# Patient Record
Sex: Male | Born: 1985 | Race: White | Hispanic: No | Marital: Married | Smoking: Current every day smoker
Health system: Southern US, Community
[De-identification: ages and names within clinical notes are randomized; demographics above are authoritative.]

## PROBLEM LIST (undated history)

## (undated) DIAGNOSIS — I10 Essential (primary) hypertension: Secondary | ICD-10-CM

## (undated) DIAGNOSIS — F32A Depression, unspecified: Secondary | ICD-10-CM

## (undated) DIAGNOSIS — F603 Borderline personality disorder: Secondary | ICD-10-CM

## (undated) DIAGNOSIS — F329 Major depressive disorder, single episode, unspecified: Secondary | ICD-10-CM

## (undated) DIAGNOSIS — F909 Attention-deficit hyperactivity disorder, unspecified type: Secondary | ICD-10-CM

## (undated) HISTORY — PX: EXTERNAL EAR SURGERY: SHX627

---

## 2010-08-24 ENCOUNTER — Emergency Department (HOSPITAL_COMMUNITY)
Admission: EM | Admit: 2010-08-24 | Discharge: 2010-08-24 | Disposition: A | Discharge status: Home / Self Care | Payer: Self-pay | Attending: Emergency Medicine | Admitting: Emergency Medicine

## 2010-08-24 DIAGNOSIS — Z0389 Encounter for observation for other suspected diseases and conditions ruled out: Secondary | ICD-10-CM | POA: Insufficient documentation

## 2010-09-07 ENCOUNTER — Ambulatory Visit (HOSPITAL_COMMUNITY): Payer: Self-pay | Admitting: Physician Assistant

## 2010-10-14 ENCOUNTER — Ambulatory Visit (HOSPITAL_COMMUNITY): Payer: Self-pay | Admitting: Physician Assistant

## 2010-11-18 ENCOUNTER — Ambulatory Visit (HOSPITAL_COMMUNITY): Payer: Self-pay | Admitting: Psychiatry

## 2013-10-21 ENCOUNTER — Emergency Department (HOSPITAL_COMMUNITY): Payer: Self-pay

## 2013-10-21 ENCOUNTER — Encounter (HOSPITAL_COMMUNITY): Payer: Self-pay | Admitting: Emergency Medicine

## 2013-10-21 ENCOUNTER — Emergency Department (HOSPITAL_COMMUNITY)
Admission: EM | Admit: 2013-10-21 | Discharge: 2013-10-21 | Disposition: A | Discharge status: Home / Self Care | Payer: Self-pay | Attending: Emergency Medicine | Admitting: Emergency Medicine

## 2013-10-21 DIAGNOSIS — S0285XA Fracture of orbit, unspecified, initial encounter for closed fracture: Secondary | ICD-10-CM

## 2013-10-21 DIAGNOSIS — S59919A Unspecified injury of unspecified forearm, initial encounter: Secondary | ICD-10-CM

## 2013-10-21 DIAGNOSIS — S0230XA Fracture of orbital floor, unspecified side, initial encounter for closed fracture: Secondary | ICD-10-CM | POA: Insufficient documentation

## 2013-10-21 DIAGNOSIS — R404 Transient alteration of awareness: Secondary | ICD-10-CM | POA: Insufficient documentation

## 2013-10-21 DIAGNOSIS — S59909A Unspecified injury of unspecified elbow, initial encounter: Secondary | ICD-10-CM | POA: Insufficient documentation

## 2013-10-21 DIAGNOSIS — S6990XA Unspecified injury of unspecified wrist, hand and finger(s), initial encounter: Secondary | ICD-10-CM | POA: Insufficient documentation

## 2013-10-21 DIAGNOSIS — Z23 Encounter for immunization: Secondary | ICD-10-CM | POA: Insufficient documentation

## 2013-10-21 DIAGNOSIS — R000 Tachycardia, unspecified: Secondary | ICD-10-CM | POA: Insufficient documentation

## 2013-10-21 MED ORDER — HYDROCODONE-ACETAMINOPHEN 5-325 MG PO TABS: 2.0000 | ORAL_TABLET | Freq: Four times a day (QID) | ORAL | Status: DC | PRN

## 2013-10-21 MED ORDER — SODIUM CHLORIDE 0.9 % IV BOLUS (SEPSIS)
1000.0000 mL | Freq: Once | INTRAVENOUS | Status: AC
  Administered 2013-10-21: 1000 mL via INTRAVENOUS

## 2013-10-21 MED ORDER — FENTANYL CITRATE 0.05 MG/ML IJ SOLN
50.0000 ug | Freq: Once | INTRAMUSCULAR | Status: AC
Start: 2013-10-21 — End: 2013-10-21
  Administered 2013-10-21: 50 ug via INTRAVENOUS
  Filled 2013-10-21: qty 2

## 2013-10-21 MED ORDER — TETANUS-DIPHTH-ACELL PERTUSSIS 5-2.5-18.5 LF-MCG/0.5 IM SUSP
0.5000 mL | Freq: Once | INTRAMUSCULAR | Status: AC
  Administered 2013-10-21: 0.5 mL via INTRAMUSCULAR
  Filled 2013-10-21: qty 0.5

## 2013-10-21 NOTE — ED Provider Notes (Signed)
  Medical screening examination/treatment/procedure(s) were performed by non-physician practitioner and as supervising physician I was immediately available for consultation/collaboration.   EKG Interpretation None         Gerhard Munchobert Avid Guillette, MD 10/21/13 1239
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
## 2013-10-21 NOTE — ED Notes (Signed)
PA at bedside.
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
## 2013-10-21 NOTE — ED Notes (Signed)
Patient transported to X-ray 
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
## 2016-01-22 IMAGING — CR DG HAND COMPLETE 3+V*L*
3 series · 3 of 3 positions shown · non-contrast
Comparison: None.

CLINICAL DATA: Hand injury.

EXAM:
LEFT HAND - COMPLETE 3+ VIEW

[x hand pa left]
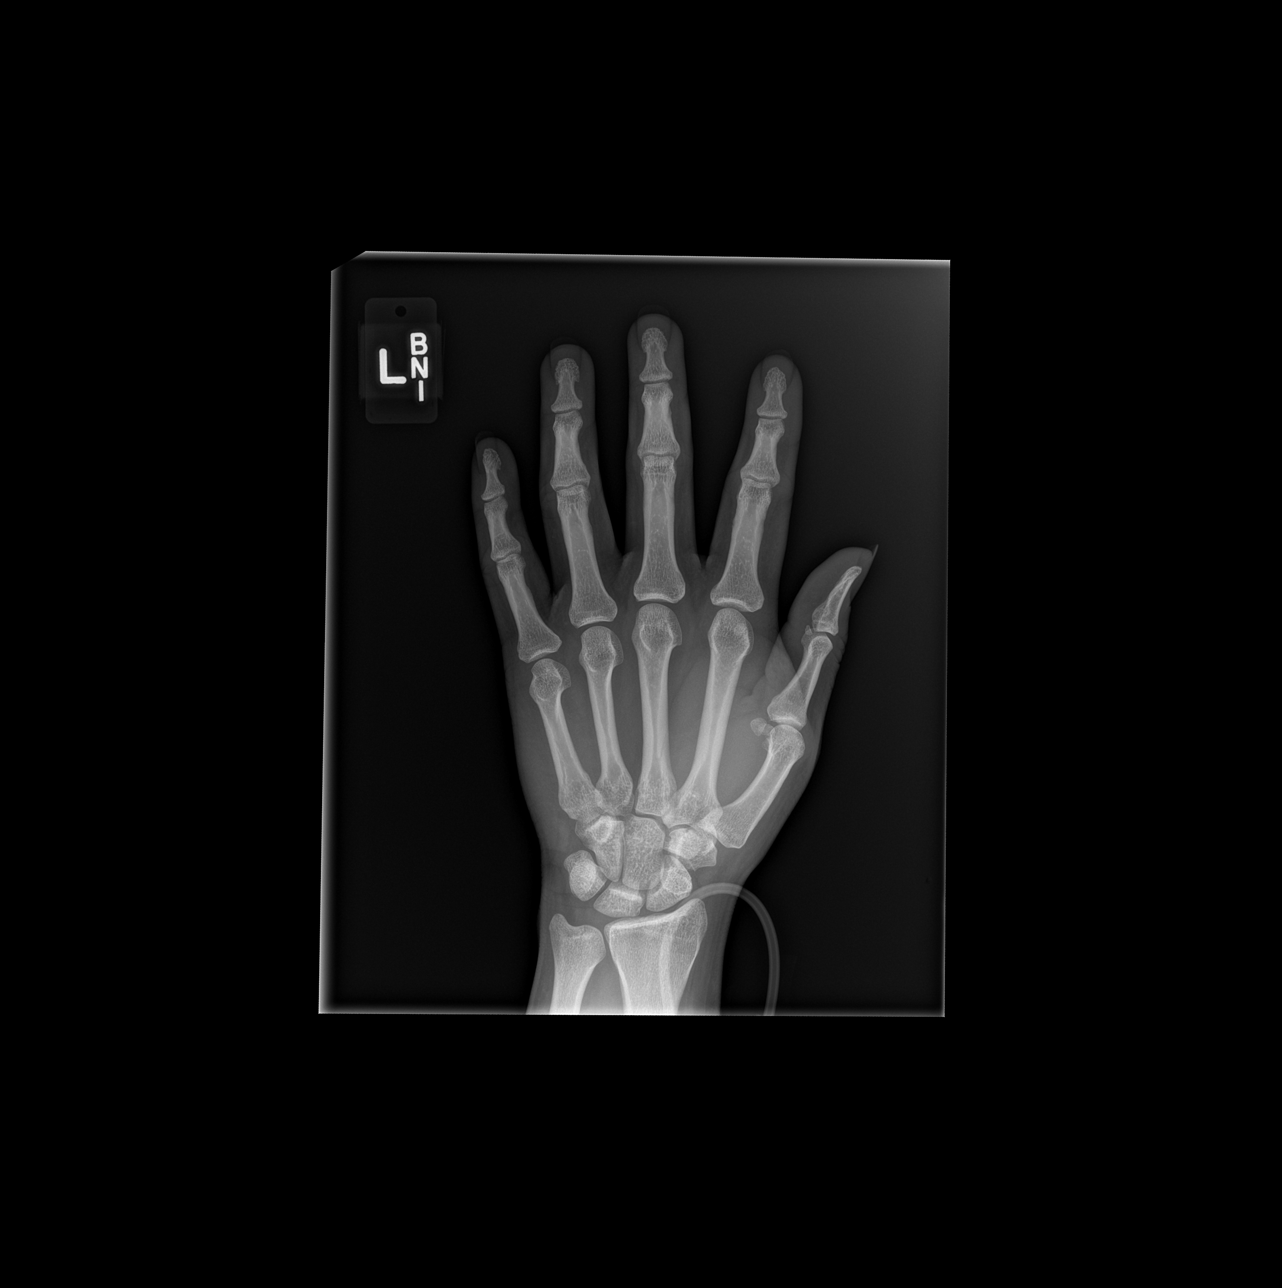

[x hand obl left]
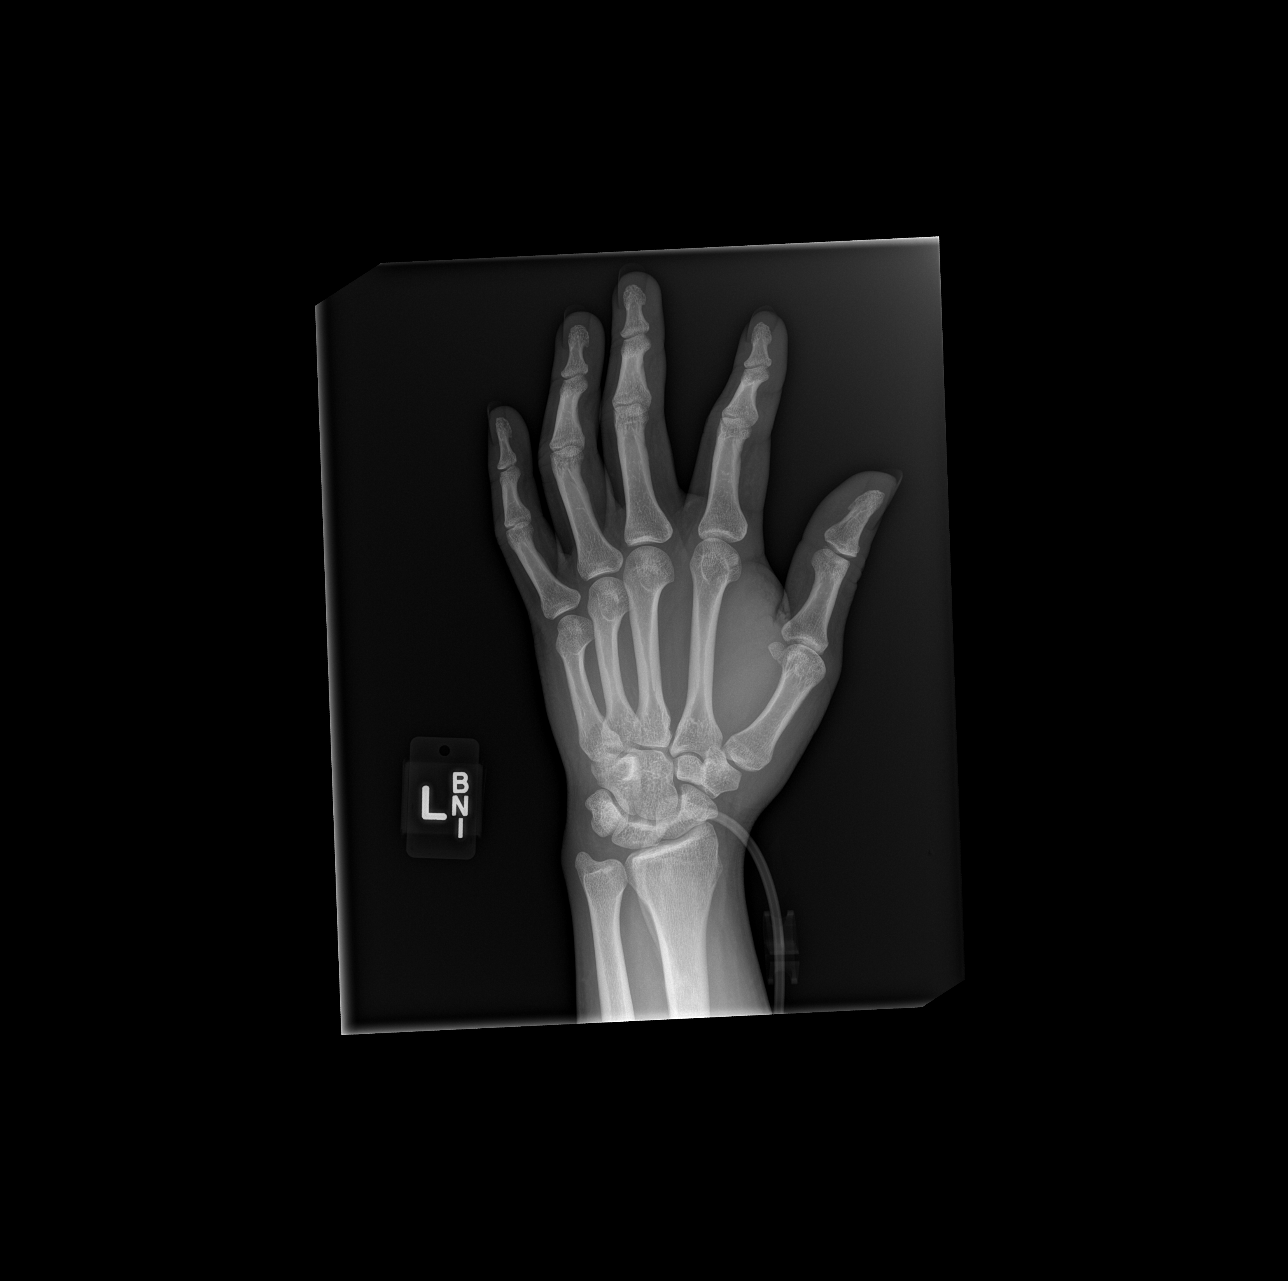

[x hand lat left]
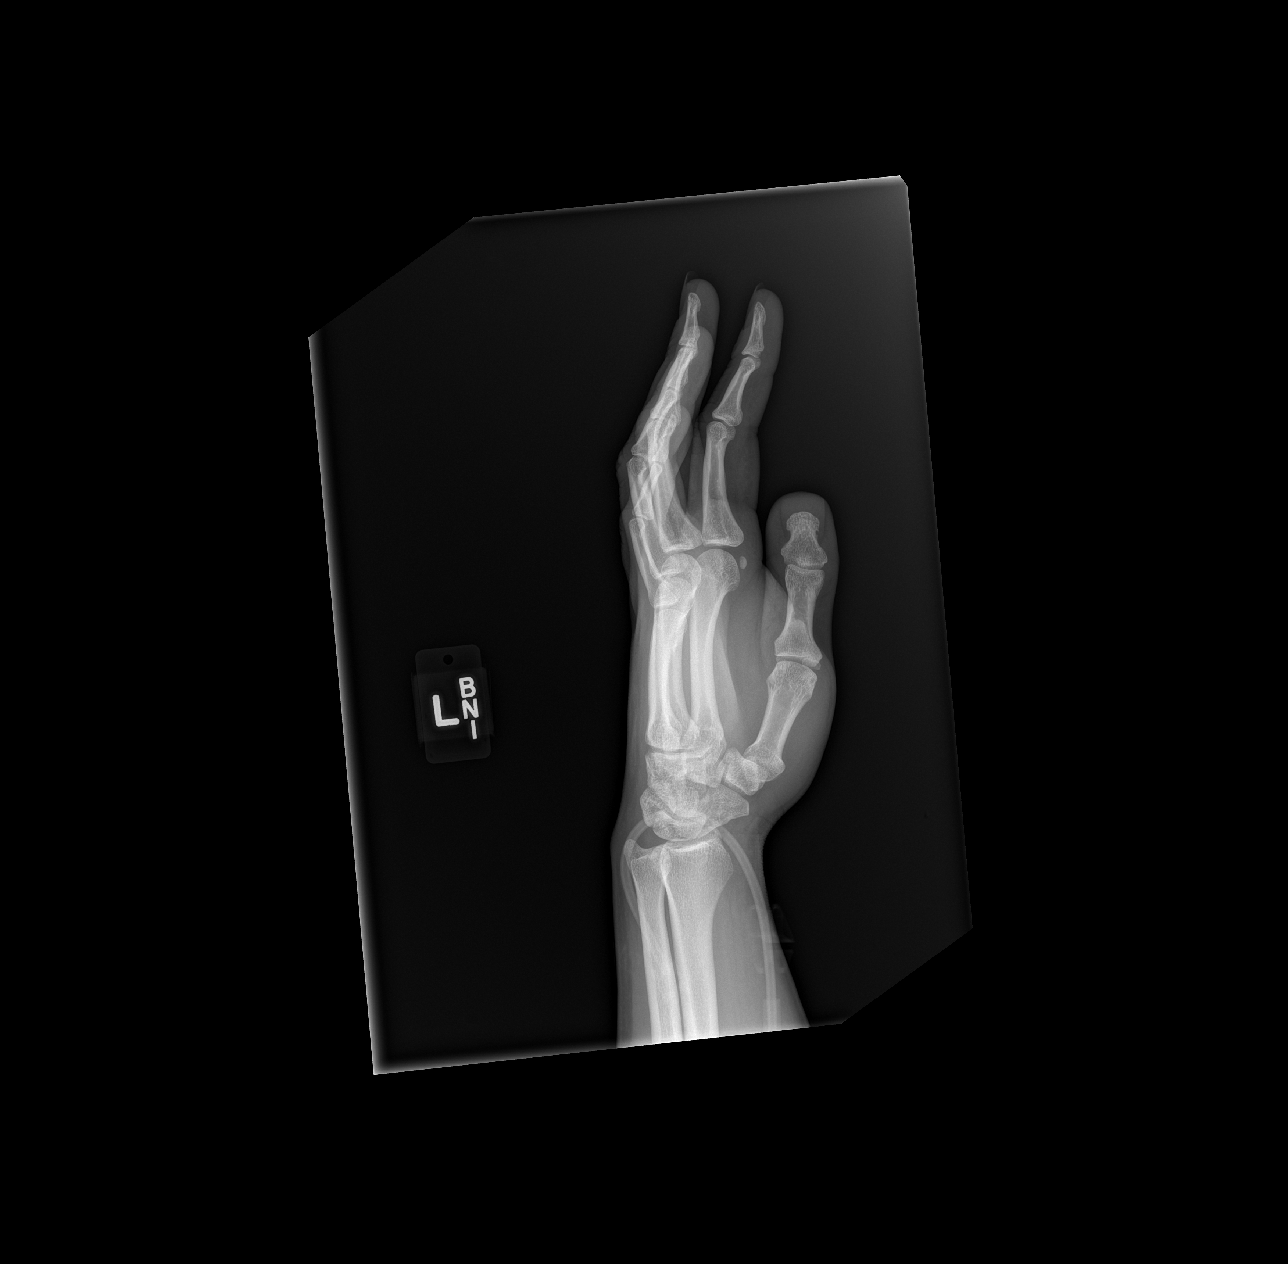

[3 of 3 positions shown; findings below may reference images not displayed]

FINDINGS: There is no evidence of fracture or dislocation. There is no
evidence of arthropathy or other focal bone abnormality. Soft
tissues are unremarkable.
IMPRESSION: Negative.

## 2016-10-31 ENCOUNTER — Encounter (HOSPITAL_COMMUNITY): Payer: Self-pay

## 2016-10-31 DIAGNOSIS — I10 Essential (primary) hypertension: Secondary | ICD-10-CM | POA: Insufficient documentation

## 2016-10-31 DIAGNOSIS — F329 Major depressive disorder, single episode, unspecified: Secondary | ICD-10-CM | POA: Insufficient documentation

## 2016-10-31 DIAGNOSIS — Z79899 Other long term (current) drug therapy: Secondary | ICD-10-CM | POA: Insufficient documentation

## 2016-10-31 DIAGNOSIS — F17200 Nicotine dependence, unspecified, uncomplicated: Secondary | ICD-10-CM | POA: Insufficient documentation

## 2016-10-31 LAB — CBC
HCT: 39.3 % (ref 39.0–52.0)
Hemoglobin: 13.2 g/dL (ref 13.0–17.0)
MCH: 28.5 pg (ref 26.0–34.0)
MCHC: 33.6 g/dL (ref 30.0–36.0)
MCV: 84.9 fL (ref 78.0–100.0)
Platelets: 190 10*3/uL (ref 150–400)
RBC: 4.63 MIL/uL (ref 4.22–5.81)
RDW: 13.7 % (ref 11.5–15.5)
WBC: 5.2 10*3/uL (ref 4.0–10.5)

## 2016-10-31 LAB — COMPREHENSIVE METABOLIC PANEL
ALT: 40 U/L (ref 17–63)
AST: 30 U/L (ref 15–41)
Albumin: 3.9 g/dL (ref 3.5–5.0)
Alkaline Phosphatase: 59 U/L (ref 38–126)
Anion gap: 7 (ref 5–15)
BUN: 20 mg/dL (ref 6–20)
CO2: 24 mmol/L (ref 22–32)
Calcium: 9 mg/dL (ref 8.9–10.3)
Chloride: 108 mmol/L (ref 101–111)
Creatinine, Ser: 1.23 mg/dL (ref 0.61–1.24)
GFR calc Af Amer: >60 mL/min (ref >60)
GFR calc non Af Amer: >60 mL/min (ref >60)
Glucose, Bld: 117 mg/dL — ABNORMAL HIGH (ref 65–99)
Potassium: 4.1 mmol/L (ref 3.5–5.1)
Sodium: 139 mmol/L (ref 135–145)
Total Bilirubin: 0.3 mg/dL (ref 0.3–1.2)
Total Protein: 6.6 g/dL (ref 6.5–8.1)

## 2016-10-31 LAB — ACETAMINOPHEN LEVEL: Acetaminophen (Tylenol), Serum: <10 ug/mL — ABNORMAL LOW (ref 10–30)

## 2016-10-31 LAB — RAPID URINE DRUG SCREEN, HOSP PERFORMED
Amphetamines: NOT DETECTED
Barbiturates: NOT DETECTED
Benzodiazepines: NOT DETECTED
Cocaine: NOT DETECTED
Opiates: NOT DETECTED
Tetrahydrocannabinol: NOT DETECTED

## 2016-10-31 LAB — SALICYLATE LEVEL: Salicylate Lvl: <7 mg/dL (ref 2.8–30.0)

## 2016-10-31 LAB — ETHANOL: Alcohol, Ethyl (B): <5 mg/dL (ref <5)

## 2016-10-31 NOTE — ED Triage Notes (Signed)
Pt reports he has been suffering from depression and has been having suicidal thought. His plan would be to slit his wrist. Denies recent attempt. 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

## 2016-11-01 ENCOUNTER — Emergency Department (HOSPITAL_COMMUNITY)
Admission: EM | Admit: 2016-11-01 | Discharge: 2016-11-01 | Disposition: A | Discharge status: Other Acute Inpatient Hospital | Payer: Self-pay | Attending: Emergency Medicine | Admitting: Emergency Medicine

## 2016-11-01 ENCOUNTER — Inpatient Hospital Stay (HOSPITAL_COMMUNITY)
Admission: EM | Admit: 2016-11-01 | Discharge: 2016-11-08 | DRG: 885 | Disposition: A | Discharge status: Home / Self Care | Payer: No Typology Code available for payment source | Source: Intra-hospital | Attending: Psychiatry | Admitting: Psychiatry

## 2016-11-01 ENCOUNTER — Encounter (HOSPITAL_COMMUNITY): Payer: Self-pay | Admitting: *Deleted

## 2016-11-01 DIAGNOSIS — F1590 Other stimulant use, unspecified, uncomplicated: Secondary | ICD-10-CM | POA: Diagnosis present

## 2016-11-01 DIAGNOSIS — Z915 Personal history of self-harm: Secondary | ICD-10-CM | POA: Diagnosis not present

## 2016-11-01 DIAGNOSIS — G4700 Insomnia, unspecified: Secondary | ICD-10-CM | POA: Diagnosis present

## 2016-11-01 DIAGNOSIS — Z560 Unemployment, unspecified: Secondary | ICD-10-CM | POA: Diagnosis not present

## 2016-11-01 DIAGNOSIS — F322 Major depressive disorder, single episode, severe without psychotic features: Principal | ICD-10-CM | POA: Diagnosis present

## 2016-11-01 DIAGNOSIS — Z590 Homelessness: Secondary | ICD-10-CM

## 2016-11-01 DIAGNOSIS — F17203 Nicotine dependence unspecified, with withdrawal: Secondary | ICD-10-CM | POA: Diagnosis not present

## 2016-11-01 DIAGNOSIS — R45851 Suicidal ideations: Secondary | ICD-10-CM

## 2016-11-01 DIAGNOSIS — F329 Major depressive disorder, single episode, unspecified: Secondary | ICD-10-CM

## 2016-11-01 DIAGNOSIS — I10 Essential (primary) hypertension: Secondary | ICD-10-CM | POA: Diagnosis present

## 2016-11-01 HISTORY — DX: Essential (primary) hypertension: I10

## 2016-11-01 HISTORY — DX: Depression, unspecified: F32.A

## 2016-11-01 HISTORY — DX: Major depressive disorder, single episode, unspecified: F32.9

## 2016-11-01 LAB — GLUCOSE, CAPILLARY: Glucose-Capillary: 83 mg/dL (ref 65–99)

## 2016-11-01 MED ORDER — MAGNESIUM HYDROXIDE 400 MG/5ML PO SUSP
30.0000 mL | Freq: Every day | ORAL | Status: DC | PRN
Start: 2016-11-01 — End: 2016-11-08

## 2016-11-01 MED ORDER — ALUM & MAG HYDROXIDE-SIMETH 200-200-20 MG/5ML PO SUSP
30.0000 mL | ORAL | Status: DC | PRN
  Administered 2016-11-02: 30 mL via ORAL
  Filled 2016-11-01: qty 30

## 2016-11-01 MED ORDER — ACETAMINOPHEN 325 MG PO TABS: 650.0000 mg | ORAL_TABLET | Freq: Four times a day (QID) | ORAL | Status: DC | PRN

## 2016-11-01 MED ORDER — TRAZODONE HCL 50 MG PO TABS
50.0000 mg | ORAL_TABLET | Freq: Every evening | ORAL | Status: DC | PRN
  Administered 2016-11-01 – 2016-11-05 (×5): 50 mg via ORAL
  Filled 2016-11-01 (×4): qty 1
  Filled 2016-11-01: qty 7
  Filled 2016-11-01: qty 1
  Filled 2016-11-01: qty 2

## 2016-11-01 MED ORDER — HYDROXYZINE HCL 25 MG PO TABS
25.0000 mg | ORAL_TABLET | Freq: Three times a day (TID) | ORAL | Status: DC | PRN
  Administered 2016-11-01 – 2016-11-06 (×6): 25 mg via ORAL
  Filled 2016-11-01 (×7): qty 1
  Filled 2016-11-01 (×2): qty 10

## 2016-11-01 NOTE — Progress Notes (Signed)
Pt attended AA group this evening.  

## 2016-11-01 NOTE — ED Notes (Signed)
TTS consulting.
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
## 2016-11-01 NOTE — ED Provider Notes (Signed)
[facility]-EMERGENCY DEPT Provider Note   CSN: [id number] Arrival date & time: 10/31/16  2146     History   Chief Complaint Chief Complaint  Patient presents with  . Suicidal    HPI Don Vaughn is a 31 y.o. male.  31 year old male presents to the emergency department for worsening depression and suicidal ideations. He states that many of his friends are "slipping away due to substance issues". 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 He denies any recent substance use or alcohol use today. He reports suicidal plan to slit his wrists tonight, but was found by his parents who then prompted his evaluation in the ED. Patient denies a history of suicide attempt. Medical history does indicate prior diagnosis with depression. Patient denies any homicidal thoughts. He has no additional complaints at this time.   The history is provided by the patient. No language interpreter was used.    Past Medical History:  Diagnosis Date  . Depression   . Hypertension     There are no active problems to display for this patient.   Past Surgical History:  Procedure Laterality Date  . EXTERNAL EAR SURGERY Right    "it was cutt off and sutured back on"     Home Medications    Prior to Admission medications   Medication Sig Start Date End Date Taking? Authorizing Provider  HYDROcodone-acetaminophen (NORCO/VICODIN) 5-325 MG per tablet Take 2 tablets by mouth every 6 (six) hours as needed for moderate pain or severe pain. 10/21/13   Roxy Horseman, PA-C    Family History No family history on file.  Social History Social History  Substance Use Topics  . Smoking status: Current Every Day Smoker  . Smokeless tobacco: Never Used  . Alcohol use No     Allergies   Patient has no known allergies.   Review of Systems Review of Systems Ten systems reviewed and are negative for acute change, except as noted in the HPI.    Physical Exam Updated Vital  Signs BP (!) 142/104 (BP Location: Left Arm)   Pulse 98   Temp 98.8 F (37.1 C) (Oral)   Resp 16   SpO2 97%   Physical Exam  Constitutional: He is oriented to person, place, and time. He appears well-developed and well-nourished. No distress.  HENT:  Head: Normocephalic and atraumatic.  Eyes: Conjunctivae and EOM are normal. No scleral icterus.  Neck: Normal range of motion.  Cardiovascular: Normal rate, regular rhythm and intact distal pulses.   Pulmonary/Chest: Effort normal. No respiratory distress.  Musculoskeletal: Normal range of motion.  Neurological: He is alert and oriented to person, place, and time. He exhibits normal muscle tone. Coordination normal.  Skin: Skin is warm and dry. No rash noted. He is not diaphoretic. No erythema. No pallor.  Psychiatric: He has a normal mood and affect. His behavior is normal. He expresses suicidal ideation. He expresses no homicidal ideation. He expresses suicidal plans. He expresses no homicidal plans.  Nursing note and vitals reviewed.    ED Treatments / Results  Labs (all labs ordered are listed, but only abnormal results are displayed) Labs Reviewed  COMPREHENSIVE METABOLIC PANEL - Abnormal; Notable for the following:       Result Value   Glucose, Bld 117 (*)    All other components within normal limits  ACETAMINOPHEN LEVEL - Abnormal; Notable for the following:    Acetaminophen (Tylenol), Serum <10 (*)    All other components within normal limits  ETHANOL  SALICYLATE LEVEL  CBC  RAPID URINE DRUG SCREEN, HOSP PERFORMED    EKG  EKG Interpretation None       Radiology No results found.  Procedures Procedures (including critical care time)  Medications Ordered in ED Medications - No data to display   Initial Impression / Assessment and Plan / ED Course  I have reviewed the triage vital signs and the nursing notes.  Pertinent labs & imaging results that were available during my care of the patient were reviewed  by me and considered in my medical decision making (see chart for details).     31 year old male presents for worsening depression and suicidal ideation. He reports plan to slit his wrists. Patient medically cleared and pending TTS recommendations. Anticipate inpatient placement. Disposition to be determined by oncoming ED provider.   Final Clinical Impressions(s) / ED Diagnoses   Final diagnoses:  Depression with suicidal ideation    New Prescriptions New Prescriptions   No medications on file     Antony MaduraHumes, Alyn Riedinger, Cordelia Poche-C 11/01/16 0541    Azalia Bilisampos, Kevin, MD 11/01/16 0630

## 2016-11-01 NOTE — ED Notes (Signed)
Regular Diet was ordered for Lunch. 

## 2016-11-01 NOTE — Progress Notes (Signed)
D-pt seems depressed but assertive, pt has hypertension and needs to be started on his home meds:  norvasc and clonidine 2x a day per patient, PA Spencer notified A-pt took some prn meds for anxiety and sleep R-cont to monitor for safety

## 2016-11-01 NOTE — Tx Team (Signed)
Initial Treatment Plan 11/01/2016 6:44 PM Don ShockMichael Mittleman WGN:[id number]    PATIENT STRESSORS: Financial difficulties Occupational concerns   PATIENT STRENGTHS: Ability for insight Average or above average intelligence Communication skills Motivation for treatment/growth Physical Health   PATIENT IDENTIFIED PROBLEMS: At risk for suicide  Depression  "Reestablish foundation for myself"  "Find some hope for the future"               DISCHARGE CRITERIA:  Ability to meet basic life and health needs Improved stabilization in mood, thinking, and/or behavior Motivation to continue treatment in a less acute level of care Need for constant or close observation no longer present  PRELIMINARY DISCHARGE PLAN: Outpatient therapy Return to previous living arrangement Return to previous work or school arrangements  PATIENT/FAMILY INVOLVEMENT: This treatment plan has been presented to and reviewed with the patient, Don Vaughn.  The patient and family have been given the opportunity to ask questions and make suggestions.  Carleene OverlieMiddleton, Fiorella Hanahan P, RN 11/01/2016, 6:44 PM

## 2016-11-01 NOTE — ED Notes (Signed)
PA at bedside.

## 2016-11-01 NOTE — BH Assessment (Signed)
Tele Assessment Note   Don Vaughn is an 31 y.o. male who reports to the ED due to [facility] with intent and a plan to harm himself.  Pt sts he is depressed due to his finances and dislike of his job.  Pt sts she had one encounter where he attempted.  He denies HI at this time. He does not endorse AVH. 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  He does not take medication and has not had any treatment,  Pt states he lives with his parents and can return when discharged.  He states he does not have family support at this time.  Pt sts he does not have any current or pending legal charges.    Pt presented in hospital scrubs with an unremarkable appearance.  Pt had fair eye contact and coherent speech.  He had freedom of movement; however, his motor activity appeared rigid due to the pain the pt expressed he was in.  He appeared very uncomfortable. His mood was depressed, sad, and in agony.  His affect was congruent with his mood.  His judgement appeared impaired and his insight very poor.  He was oriented to people, place time, and situation.  Pt meets criteria for Inpatient services per Donell Sievert, PA   Diagnosis: Stimulant Use Disorder, Cannabis Use Disorder, and Substance Induced Mood Disorder  Past Medical History:  Past Medical History:  Diagnosis Date  . Depression   . Hypertension     Past Surgical History:  Procedure Laterality Date  . EXTERNAL EAR SURGERY Right    "it was cutt off and sutured back on"    Family History: No family history on file.  Social History:  reports that he has been smoking.  He has never used smokeless tobacco. He reports that he does not drink alcohol. His drug history is not on file.  Additional Social History:  Alcohol / Drug Use Pain Medications: See MAR Prescriptions: See MAR Over the Counter: See MAR Longest period of sobriety (when/how long): 1 year 2017 Negative Consequences of Use: Financial, Legal, Personal relationships, Work  / School Substance #1 Name of Substance 1: Methapmphetamines 1 - Age of First Use: 29 1 - Amount (size/oz): 1/2 gram per day 1 - Frequency: daily 1 - Duration: 2 years 1 - Last Use / Amount: 10/25/2016 Substance #2 Name of Substance 2: Marijuana 2 - Age of First Use: 17 2 - Amount (size/oz): 1 joint 2 - Frequency: 1 per months 2 - Duration: 9 years 2 - Last Use / Amount: 11/01/2016  CIWA: CIWA-Ar BP: (!) 126/54 Pulse Rate: 76 Nausea and Vomiting: no nausea and no vomiting Tactile Disturbances: none Tremor: no tremor Auditory Disturbances: not present Paroxysmal Sweats: no sweat visible Visual Disturbances: not present Anxiety: no anxiety, at ease Headache, Fullness in Head: none present Agitation: normal activity Orientation and Clouding of Sensorium: oriented and can do serial additions CIWA-Ar Total: 0 COWS: Clinical Opiate Withdrawal Scale (COWS) Resting Pulse Rate: Pulse Rate 80 or below Sweating: No report of chills or flushing Restlessness: Able to sit still Pupil Size: Pupils pinned or normal size for room light Bone or Joint Aches: Not present Runny Nose or Tearing: Not present GI Upset: No GI symptoms Tremor: No tremor Yawning: No yawning Anxiety or Irritability: None Gooseflesh Skin: Skin is smooth COWS Total Score: 0  PATIENT STRENGTHS: (choose at least two) Average or above average intelligence Communication skills Special hobby/interest Supportive family/friends  Allergies: No Known Allergies  Home Medications:  (  Not in a hospital admission)  OB/GYN Status:  No LMP for male patient.  General Assessment Data [facility] Assessment: In system Is this a Tele or Face-to-Face Assessment?: Tele Assessment Is this an Initial Assessment or a Re-assessment for this encounter?: Initial Assessment Marital status: Single Living Arrangements: Parent Can pt return to current living arrangement?: Yes Admission Status: Voluntary Is  patient capable of signing voluntary admission?: Yes Referral Source: Self/Family/Friend Insurance type: None  Medical Screening Exam [facility] Walk-in ONLY) Medical Exam completed: Yes  Crisis Care Plan Living Arrangements: Parent Legal Guardian: Other: (Self) Name of Psychiatrist: None reported Name of Therapist: None reported  Education Status Is patient currently in school?: No Highest grade of school patient has completed: Some college  Risk to self with the past 6 months Suicidal Ideation: Yes-Currently Present Has patient been a risk to self within the past 6 months prior to admission? : Yes Suicidal Intent: Yes-Currently Present Has patient had any suicidal intent within the past 6 months prior to admission? : Yes Is patient at risk for suicide?: Yes Suicidal Plan?: Yes-Currently Present Has patient had any suicidal plan within the past 6 months prior to admission? : Yes Specify Current Suicidal Plan: Pt sts he was going to get in the pool and slit his wrists Access to Means: Yes Specify Access to Suicidal Means: Pt sts he has a pool and access to a razor blade What has been your use of drugs/alcohol within the last 12 months?: yes Previous Attempts/Gestures: Yes How many times?: 1 Triggers for Past Attempts: Other (Comment) (depression) Intentional Self Injurious Behavior: None Family Suicide History: No Recent stressful life event(s): Financial Problems, Conflict (Comment), Other (Comment) (Don't like his job and not getting enought hours) Persecutory voices/beliefs?: Yes Depression: Yes Depression Symptoms: Insomnia, Tearfulness, Isolating, Fatigue, Feeling angry/irritable, Loss of interest in usual pleasures, Guilt, Feeling worthless/self pity Substance abuse history and/or treatment for substance abuse?: Yes Suicide prevention information given to non-admitted patients: Not applicable  Risk to Others within the past 6 months Does patient have any lifetime risk of  violence toward others beyond the six months prior to admission? : No Thoughts of Harm to Others: No-Not Currently Present/Within Last 6 Months Current Homicidal Plan: No Access to Homicidal Means: No Identified Victim: none History of harm to others?: No Assessment of Violence: None Noted Violent Behavior Description: None Does patient have access to weapons?: No Criminal Charges Pending?: No Does patient have a court date: No Is patient on probation?: No  Psychosis Hallucinations: None noted Delusions: None noted  Mental Status Report Appearance/Hygiene: Unremarkable, In scrubs Eye Contact: Fair Motor Activity: Rigidity Speech: Logical/coherent Level of Consciousness: Alert Mood: Depressed, Sad, Worthless, low self-esteem, Guilty Affect: Sad, Depressed Anxiety Level: None Judgement: Unimpaired Orientation: Person, Place, Time, Situation Obsessive Compulsive Thoughts/Behaviors: None  Cognitive Functioning Concentration: Normal Memory: Recent Intact, Remote Intact IQ: Average Insight: Poor Impulse Control: Poor Appetite: Good Weight Loss: 0 Weight Gain: 0 Sleep: Increased Total Hours of Sleep: 4 Vegetative Symptoms: None  ADLScreening [facility] Assessment Services) Patient's cognitive ability adequate to safely complete daily activities?: Yes Patient able to express need for assistance with ADLs?: Yes Independently performs ADLs?: Yes (appropriate for developmental age)  Prior Inpatient Therapy Prior Inpatient Therapy: Yes Prior Therapy Dates: 11/2016 Prior Therapy Facilty/Provider(s): [facility]  Reason for Treatment: Depression and substance abuse  Prior Outpatient Therapy Prior Therapy Dates: Unknown Prior Therapy Facilty/Provider(s): Unknown Reason for Treatment: none reported Does patient have an  ACCT team?: No Does patient have Intensive In-House Services?  : No Does patient have [facility] services? : No Does patient have P4CC services?: No  ADL  Screening (condition at time of admission) Patient's cognitive ability adequate to safely complete daily activities?: Yes Patient able to express need for assistance with ADLs?: Yes Independently performs ADLs?: Yes (appropriate for developmental age)             [employer]chant [employer] officer (For Healthcare) Does Patient Have a Medical Advance Directive?: No Would patient like information on creating a medical advance directive?: No - Patient declined    Additional Information 1:1 In Past 12 Months?: No CIRT Risk: No Elopement Risk: No Does patient have medical clearance?: Yes     Disposition:  Disposition Disposition of Patient: Inpatient treatment program (Per Donell Sievert, PA) Type of inpatient treatment program: Adult  Zenovia Jordan [facility] 11/01/2016 6:33 AM

## 2016-11-01 NOTE — Progress Notes (Signed)
Admission Note:  31 year old male who presents voluntary, in no acute distress, for the treatment of SI and Depression. Prior to admission, patient was SI with a plan to cut his wrist.  Patient appears flat and depressed. Patient was calm and cooperative with admission process. Patient presents with passive SI and contracts for safety upon admission. Patient denies AVH.  Patient identifies multiple stressors to include: working a stressful job, mother being sick, siblings being well-off and abandoning the family, and being a caregiver to his parents.  Patient states "I should be starting my own family but instead I'm taking care of my parents and I feel like I'm doing it by myself".  Patient reports that he has been depressed "for awhile" and suicidal the last "couple of weeks".  Patient reports past medical hx of depression and HTN.  Patient currently lives with his mother and father and is unable to identify a support system.  [facility] patient would like to "reestablish foundation for myself" and "find some hope for the future".  Patient reports past hx of amphetamines abuse and states that he has been "clean" for "3 months".  Skin was assessed and found to be clear of any abnormal marks. Patient searched and no contraband found, POC and unit policies explained and understanding verbalized. Consents obtained.  Patient had no additional questions or concerns.

## 2016-11-01 NOTE — [facility] Counselor (Signed)
[facility] Accepting provider Donell SievertSpencer Simon PA. Attending Dr. Jama Flavorsobos. Report number [phone].  [id number]Kaily Wragg BakerLPC, LCAS

## 2016-11-01 NOTE — ED Notes (Signed)
[facility] called for transport. 

## 2016-11-02 ENCOUNTER — Encounter (HOSPITAL_COMMUNITY): Payer: Self-pay | Admitting: Psychiatry

## 2016-11-02 DIAGNOSIS — F419 Anxiety disorder, unspecified: Secondary | ICD-10-CM

## 2016-11-02 DIAGNOSIS — R45851 Suicidal ideations: Secondary | ICD-10-CM

## 2016-11-02 DIAGNOSIS — F322 Major depressive disorder, single episode, severe without psychotic features: Secondary | ICD-10-CM | POA: Clinically undetermined

## 2016-11-02 MED ORDER — AMLODIPINE 1 MG/ML ORAL SUSPENSION: 5.0000 mg | Freq: Every day | ORAL | Status: DC

## 2016-11-02 MED ORDER — BUPROPION HCL 75 MG PO TABS
ORAL_TABLET | ORAL | Status: AC
  Filled 2016-11-02: qty 1

## 2016-11-02 MED ORDER — BUPROPION HCL 75 MG PO TABS
75.0000 mg | ORAL_TABLET | Freq: Every morning | ORAL | Status: DC
  Administered 2016-11-02 – 2016-11-05 (×3): 75 mg via ORAL
  Filled 2016-11-02: qty 7
  Filled 2016-11-02 (×2): qty 1
  Filled 2016-11-02: qty 7
  Filled 2016-11-02 (×2): qty 1

## 2016-11-02 MED ORDER — AMLODIPINE BESYLATE 5 MG PO TABS
5.0000 mg | ORAL_TABLET | Freq: Every day | ORAL | Status: DC
  Administered 2016-11-02 – 2016-11-08 (×4): 5 mg via ORAL
  Filled 2016-11-02: qty 7
  Filled 2016-11-02: qty 1
  Filled 2016-11-02: qty 7
  Filled 2016-11-02: qty 1
  Filled 2016-11-02: qty 7
  Filled 2016-11-02: qty 1
  Filled 2016-11-02: qty 7
  Filled 2016-11-02: qty 1
  Filled 2016-11-02: qty 7
  Filled 2016-11-02: qty 1
  Filled 2016-11-02: qty 7

## 2016-11-02 MED ORDER — CLONIDINE HCL 0.1 MG PO TABS
0.1000 mg | ORAL_TABLET | Freq: Two times a day (BID) | ORAL | Status: DC
  Administered 2016-11-02 – 2016-11-08 (×7): 0.1 mg via ORAL
  Filled 2016-11-02: qty 1
  Filled 2016-11-02 (×3): qty 14
  Filled 2016-11-02: qty 1
  Filled 2016-11-02 (×3): qty 14
  Filled 2016-11-02 (×5): qty 1
  Filled 2016-11-02 (×5): qty 14
  Filled 2016-11-02: qty 1

## 2016-11-02 NOTE — Progress Notes (Signed)
D-pt seems depressed A-pt took his am meds, started on a new medication, pt went back to bed after breakfast this am, pt did not attend group and did not fill out a self inventory form R-cont to monitor for safety

## 2016-11-02 NOTE — [facility] Suicide Risk Assessment (Signed)
[facility] INPATIENT:  Family/Significant Other Suicide Prevention Education  Suicide Prevention Education:  Patient Refusal for Family/Significant Other Suicide Prevention Education: The patient Don Vaughn has refused to provide written consent for family/significant other to be provided Family/Significant Other Suicide Prevention Education during admission and/or prior to discharge.  Physician notified.  Verdene LennertLauren C Nabeeha Badertscher 11/02/2016, 11:04 AM

## 2016-11-02 NOTE — Tx Team (Signed)
Interdisciplinary Treatment and Diagnostic Plan Update  11/02/2016 Time of Session: 0930AM Don Vaughn MRN: [id number]  Principal Diagnosis: Major depressive disorder, single episode, severe without psychotic features ([place])  Secondary Diagnoses: Principal Problem:   Major depressive disorder, single episode, severe without psychotic features ([place]) Active Problems:   Stimulant use disorder   Current Medications:  Current Facility-Administered Medications  Medication Dose Route Frequency Provider Last Rate Last Dose  . acetaminophen (TYLENOL) tablet 650 mg  650 mg Oral Q6H PRN Okonkwo, [name], NP      . alum & mag hydroxide-simeth (MAALOX/MYLANTA) 200-200-20 MG/5ML suspension 30 mL  30 mL Oral Q4H PRN Okonkwo, [name], NP      . buPROPion (WELLBUTRIN) tablet 75 mg  75 mg Oral q morning - 10a Eappen, Saramma, MD      . hydrOXYzine (ATARAX/VISTARIL) tablet 25 mg  25 mg Oral TID PRN Lu Duffel, [name], NP   25 mg at 11/01/16 2128  . magnesium hydroxide (MILK OF MAGNESIA) suspension 30 mL  30 mL Oral Daily PRN Okonkwo, [name], NP      . traZODone (DESYREL) tablet 50 mg  50 mg Oral QHS PRN Okonkwo, [name], NP   50 mg at 11/01/16 2128   PTA Medications: Prescriptions Prior to Admission  Medication Sig Dispense Refill Last Dose  . HYDROcodone-acetaminophen (NORCO/VICODIN) 5-325 MG per tablet Take 2 tablets by mouth every 6 (six) hours as needed for moderate pain or severe pain. (Patient not taking: Reported on 11/01/2016) 13 tablet 0 Not Taking at Unknown time    Patient Stressors: Financial difficulties Occupational concerns  Patient Strengths: Ability for insight Average or above average intelligence Communication skills Motivation for treatment/growth Physical Health  Treatment Modalities: Medication Management, Group therapy, Case management,  1 to 1 session with clinician, Psychoeducation, Recreational therapy.   Physician Treatment Plan for Primary Diagnosis:  Major depressive disorder, single episode, severe without psychotic features ([place]) Long Term Goal(s):     Short Term Goals:    Medication Management: Evaluate patient's response, side effects, and tolerance of medication regimen.  Therapeutic Interventions: 1 to 1 sessions, Unit Group sessions and Medication administration.  Evaluation of Outcomes: Not Met  Physician Treatment Plan for Secondary Diagnosis: Principal Problem:   Major depressive disorder, single episode, severe without psychotic features ([place]) Active Problems:   Stimulant use disorder  Long Term Goal(s):     Short Term Goals:       Medication Management: Evaluate patient's response, side effects, and tolerance of medication regimen.  Therapeutic Interventions: 1 to 1 sessions, Unit Group sessions and Medication administration.  Evaluation of Outcomes: Not Met   RN Treatment Plan for Primary Diagnosis: Major depressive disorder, single episode, severe without psychotic features ([place]) Long Term Goal(s): Knowledge of disease and therapeutic regimen to maintain health will improve  Short Term Goals: Ability to remain free from injury will improve, Ability to verbalize feelings will improve and Ability to disclose and discuss suicidal ideas  Medication Management: RN will administer medications as ordered by provider, will assess and evaluate patient's response and provide education to patient for prescribed medication. RN will report any adverse and/or side effects to prescribing provider.  Therapeutic Interventions: 1 on 1 counseling sessions, Psychoeducation, Medication administration, Evaluate responses to treatment, Monitor vital signs and CBGs as ordered, Perform/monitor CIWA, COWS, AIMS and Fall Risk screenings as ordered, Perform wound care treatments as ordered.  Evaluation of Outcomes: Not Met   LCSW Treatment Plan for Primary Diagnosis: Major depressive disorder,  single episode, severe without psychotic  features [facility]) Long Term Goal(s): Safe transition to appropriate next level of care at discharge, Engage patient in therapeutic group addressing interpersonal concerns.  Short Term Goals: Engage patient in aftercare planning with referrals and resources, Facilitate patient progression through stages of change regarding substance use diagnoses and concerns and Identify triggers associated with mental health/substance abuse issues  Therapeutic Interventions: Assess for all discharge needs, 1 to 1 time with Social worker, Explore available resources and support systems, Assess for adequacy in community support network, Educate family and significant other(s) on suicide prevention, Complete Psychosocial Assessment, Interpersonal group therapy.  Evaluation of Outcomes: Not Met   Progress in Treatment: Attending groups: No. New to unit.  Continuing to assess.  Participating in groups: No. Taking medication as prescribed: Yes. Toleration medication: Yes. Family/Significant other contact made: No, will contact:  family member if patient consents Patient understands diagnosis: Yes. Discussing patient identified problems/goals with staff: No. Medical problems stabilized or resolved: Yes. Denies suicidal/homicidal ideation: No. Passive SI/Able to contract for safety on the unit.  Issues/concerns per patient self-inventory: No. Other: n/a   New problem(s) identified: No, Describe:  n/a  New Short Term/Long Term Goal(s): detox, medication stabilization, elimination of SI thoughts, development of comprehensive mental wellness/sobriety plan.   Patient Goal: to get put on medication and start feeling better.  Discharge Plan or Barriers: CSW assessing for appropriate referrals. Pt has no current mental health providers. He lives at home with parents and plans to return there at discharge. Pt is employed.   Reason for Continuation of Hospitalization: Depression Medication stabilization Suicidal  ideation Withdrawal symptoms  Estimated Length of Stay: tentative discharge on Monday, 11/07/16  Attendees: Patient: 11/02/2016 11:13 AM  Physician: Dr. Shea Evans MD 11/02/2016 11:13 AM  Nursing: Modena Jansky RN 11/02/2016 11:13 AM  RN Care Manager: Lars Pinks CM 11/02/2016 11:13 AM  Social Worker: Press photographer, LCSW 11/02/2016 11:13 AM  Recreational Therapist: x 11/02/2016 11:13 AM  Other:  11/02/2016 11:13 AM  Other:  11/02/2016 11:13 AM  Other: 11/02/2016 11:13 AM    Scribe for Treatment Team: Kimber Relic Smart, LCSW 11/02/2016 11:13 AM

## 2016-11-02 NOTE — Progress Notes (Signed)
Pt said he do not like getting his blood pressure and he do not want to do the damn shit anymore. RN was notify.

## 2016-11-02 NOTE — H&P (Signed)
Psychiatric Admission Assessment Adult  Patient Identification: Don Vaughn MRN:  [id number] Date of Evaluation:  11/02/2016 Chief Complaint: Pt states " I am depressed."  Principal Diagnosis: Major depressive disorder, single episode, severe without psychotic features (HCC) Diagnosis:   Patient Active Problem List   Diagnosis Date Noted  . Major depressive disorder, single episode, severe without psychotic features (HCC) [F32.2] 11/02/2016  . Stimulant use disorder [F15.90] 11/01/2016   History of Present Illness:Don Vaughn is a 43 y old AAM,homeless, unemployed  who has a hx of depression as well as stimulant abuse , presented to the ED with worsening SI with plan to cut his wrist.  Patient seen and chart reviewed.Discussed patient with treatment team. Pt today seen as sad, withdrawn, has psychomotor retardation, reports worsening depressive sx. Reports he has trouble concentrating as well as has been having worsening SI with plan to cut .  Pt reports stressors of job loss , lack of social support , as well as homelessness.   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 Pt reports that he stopped using it because of the way it made him feel. He felt euphoric and hyper when he took it.   Pt denies any perceptual disturbances, denies any anxiety sx.  Denies hx of trauma.  Reports past admissions to IP units at [facility], [place] , but states he never stayed on any medications .  Reports hx of suicide attempts x3 in the past .    Associated Signs/Symptoms: Depression Symptoms:  depressed mood, anhedonia, insomnia, psychomotor retardation, fatigue, feelings of worthlessness/guilt, difficulty concentrating, hopelessness, impaired memory, recurrent thoughts of death, suicidal thoughts with specific plan, (Hypo) Manic Symptoms:  denies Anxiety Symptoms:  denies Psychotic Symptoms:  denies PTSD Symptoms: Negative Total Time spent  with patient: 45 minutes  Past Psychiatric History: please see above HP section  Is the patient at risk to self? Yes.    Has the patient been a risk to self in the past 6 months? Yes.    Has the patient been a risk to self within the distant past? Yes.    Is the patient a risk to others? No.  Has the patient been a risk to others in the past 6 months? No.  Has the patient been a risk to others within the distant past? No.   Prior Inpatient Therapy:   Prior Outpatient Therapy:    Alcohol Screening: 1. How often do you have a drink containing alcohol?: Monthly or less 2. How many drinks containing alcohol do you have on a typical day when you are drinking?: 1 or 2 3. How often do you have six or more drinks on one occasion?: Never Preliminary Score: 0 9. Have you or someone else been injured as a result of your drinking?: No 10. Has a relative or friend or a doctor or another health worker been concerned about your drinking or suggested you cut down?: No Alcohol Use Disorder Identification Test Final Score (AUDIT): 1 Brief Intervention: AUDIT score less than 7 or less-screening does not suggest unhealthy drinking-brief intervention not indicated Substance Abuse History in the last 12 months:  Yes.  stimulants Consequences of Substance Abuse: Medical Consequences:  IP admissions Family Consequences:  relational issues Previous Psychotropic Medications: Yes - unknown Psychological Evaluations: Yes  Past Medical History:  Past Medical History:  Diagnosis Date  . Depression   . Hypertension     Past Surgical History:  Procedure Laterality Date  .  EXTERNAL EAR SURGERY Right    "it was cutt off and sutured back on"   Family History:  Family History  Problem Relation Age of Onset  . Mental illness Neg Hx    Family Psychiatric  History: denies  Tobacco Screening: Have you used any form of tobacco in the last 30 days? (Cigarettes, Smokeless Tobacco, Cigars, and/or Pipes): No Social  History: single , is job less , homeless, has parents who live in [place] , has a sister and a brother , but states he does not want to live with them.graduated HS , some college. History  Alcohol Use No     History  Drug use: Unknown    Additional Social History: Marital status: Single Does patient have children?: No                         Allergies:  No Known Allergies Lab Results:  Results for orders placed or performed during the hospital encounter of 11/01/16 (from the past 48 hour(s))  Glucose, capillary     Status: None   Collection Time: 11/01/16  3:46 PM  Result Value Ref Range   Glucose-Capillary 83 65 - 99 mg/dL    Blood Alcohol level:  Lab Results  Component Value Date   ETH <5 10/31/2016    Metabolic Disorder Labs:  No results found for: HGBA1C, MPG No results found for: PROLACTIN No results found for: CHOL, TRIG, HDL, CHOLHDL, VLDL, LDLCALC  Current Medications: Current Facility-Administered Medications  Medication Dose Route Frequency Provider Last Rate Last Dose  . buPROPion (WELLBUTRIN) 75 MG tablet           . acetaminophen (TYLENOL) tablet 650 mg  650 mg Oral Q6H PRN Okonkwo, [name], NP      . alum & mag hydroxide-simeth (MAALOX/MYLANTA) 200-200-20 MG/5ML suspension 30 mL  30 mL Oral Q4H PRN Okonkwo, [name], NP      . buPROPion (WELLBUTRIN) tablet 75 mg  75 mg Oral q morning - 10a Atlantis Delong, MD   75 mg at 11/02/16 1127  . hydrOXYzine (ATARAX/VISTARIL) tablet 25 mg  25 mg Oral TID PRN [name], NP   25 mg at 11/01/16 2128  . magnesium hydroxide (MILK OF MAGNESIA) suspension 30 mL  30 mL Oral Daily PRN Okonkwo, [name], NP      . traZODone (DESYREL) tablet 50 mg  50 mg Oral QHS PRN Okonkwo, [name], NP   50 mg at 11/01/16 2128   PTA Medications: Prescriptions Prior to Admission  Medication Sig Dispense Refill Last Dose  . HYDROcodone-acetaminophen (NORCO/VICODIN) 5-325 MG per tablet Take 2 tablets by mouth every  6 (six) hours as needed for moderate pain or severe pain. (Patient not taking: Reported on 11/01/2016) 13 tablet 0 Not Taking at Unknown time    Musculoskeletal: Strength & Muscle Tone: within normal limits Gait & Station: normal Patient leans: N/A  Psychiatric Specialty Exam: Physical Exam  Review of Systems  Psychiatric/Behavioral: Positive for depression, substance abuse and suicidal ideas. The patient is nervous/anxious.   All other systems reviewed and are negative.   Blood pressure (!) 157/112, pulse 62, temperature 99.2 F (37.3 C), temperature source Oral, resp. rate 18, height 5\' 10"  (1.778 m), weight 110.1 kg (242 lb 11.6 oz).Body mass index is 34.83 kg/m.  General Appearance: Casual  Eye Contact:  Fair  Speech:  Clear and Coherent  Volume:  Normal  Mood:  Depressed and Dysphoric  Affect:  Appropriate  Thought Process:  Goal Directed and Descriptions of Associations: Intact  Orientation:  Full (Time, Place, and Person)  Thought Content:  Rumination  Suicidal Thoughts:  Yes.  with intent/plan  Homicidal Thoughts:  No  Memory:  Immediate;   Fair Recent;   Fair Remote;   Fair  Judgement:  Impaired  Insight:  Shallow  Psychomotor Activity:  Decreased  Concentration:  Concentration: Poor and Attention Span: Poor  Recall:  Poor  Fund of Knowledge:  Fair  Language:  Fair  Akathisia:  No  Handed:  Right  AIMS (if indicated):     Assets:  Communication Skills Desire for Improvement  ADL's:  Intact  Cognition:  WNL  Sleep:  Number of Hours: 8    Treatment Plan Summary:Patient with worsening depression, suicidal thoughts, who has a hx of stimulant abuse , several psychosocial stressors , lack of social support , will start medications and monitor. Daily contact with patient to assess and evaluate symptoms and progress in treatment, Medication management and Plan see below Patient will benefit from inpatient treatment and stabilization.  Estimated length of stay is 5-7  days.  Reviewed past medical records,treatment plan.  Wellbutrin 75 mg po daily for affective sx. Trazodone 50 mg po qhs prn for insomnia. Vistaril 25 mg po tid prn for anxiety sx. Will continue to monitor vitals ,medication compliance and treatment side effects while patient is here.  Will monitor for medical issues as well as call consult as needed.  Reviewed labs cbc- wnl, cmp - wnl , uds - negative ,will order tsh. CSW will start working on disposition.  Patient to participate in therapeutic milieu .      Observation Level/Precautions:  15 minute checks    Psychotherapy:  Individual and group therapy     Consultations:  CSW  Discharge Concerns:  stability       Physician Treatment Plan for Primary Diagnosis: Major depressive disorder, single episode, severe without psychotic features (HCC) Long Term Goal(s): Improvement in symptoms so as ready for discharge  Short Term Goals: Ability to verbalize feelings will improve and Compliance with prescribed medications will improve  Physician Treatment Plan for Secondary Diagnosis: Principal Problem:   Major depressive disorder, single episode, severe without psychotic features (HCC) Active Problems:   Stimulant use disorder  Long Term Goal(s): Improvement in symptoms so as ready for discharge  Short Term Goals: Ability to verbalize feelings will improve and Compliance with prescribed medications will improve  I certify that inpatient services furnished can reasonably be expected to improve the patient's condition.    Estefan Pattison, MD 6/27/20182:07 PM

## 2016-11-02 NOTE — [facility] Counselor (Signed)
Adult Comprehensive Assessment  Patient ID: Don Vaughn, male   DOB: [birth year], 31 y.o.   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 caregiver for parents who are elderly Surveyor, quantityinancial / Lack of resources (include bankruptcy): Limited income Housing / Lack of housing: Pt is homeless, has been going from house to house Physical health (include injuries & life threatening diseases): None reported Social relationships: limited social support Substance abuse: Pt reports being clean from meth for the last 62mo Bereavement / Loss: None reported  Living/Environment/Situation:  Living Arrangements: Other (Comment) (couch surfing) Living conditions (as described by patient or guardian): transient How long has patient lived in current situation?: 62mo What is atmosphere in current home: Chaotic  Family History:  Marital status: Single Does patient have children?: No  Childhood History:  By whom was/is the patient raised?: Both parents Description of patient's relationship with caregiver when they were a child: "okay" Patient's description of current relationship with people who raised him/her: "it's okay"; parents are elderly and he helps care for them  Does patient have siblings?: Yes Number of Siblings: 2 Description of patient's current relationship with siblings: not a very good relationship; limited contact Did patient suffer any verbal/emotional/physical/sexual abuse as a child?: No Did patient suffer from severe childhood neglect?: No Has patient ever been sexually abused/assaulted/raped as an adolescent or adult?: No Was the patient ever a victim of a crime or a disaster?: No Witnessed domestic violence?: No Has patient been effected by domestic violence as an adult?: No  Education:  Highest grade of school  patient has completed: Some college Currently a Consulting civil engineerstudent?: No Learning disability?: No  Employment/Work Situation:   Employment situation: Employed 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 How long has patient been employed?: few months Patient's job has been impacted by current illness: No What is the longest time patient has a held a job?: n/a Where was the patient employed at that time?: n/a Has patient ever been in the Eli Lilly and Companymilitary?: No Has patient ever served in combat?: No Did You Receive Any Psychiatric Treatment/Services While in Equities traderthe Military?: No Are There Guns or Other Weapons in Your Home?: No  Financial Resources:   Financial resources: Income from employment Does patient have a representative payee or guardian?: No  Alcohol/Substance Abuse:   What has been your use of drugs/alcohol within the last 12 months?: clean for 62mo from meth If attempted suicide, did drugs/alcohol play a role in this?: No Alcohol/Substance Abuse Treatment Hx: Past detox Has alcohol/substance abuse ever caused legal problems?: No  Social Support System:   Conservation officer, natureatient's Community Support System: Poor Describe Community Support System: limited support Type of faith/religion: none How does patient's faith help to cope with current illness?: n/a  Leisure/Recreation:   Leisure and Hobbies: nothing anymore, used to like music, Publishing rights managersocial stuff   Strengths/Needs:   What things does the patient do well?: "I guess but I don't know" In what areas does patient struggle / problems for patient: "a lot of things"  Discharge Plan:   Does patient have access to transportation?: No Plan for no access to transportation at discharge: city bus Will patient be returning to same living situation after discharge?: No Plan for living situation after discharge: [facility], boarding Currently receiving community mental health services: No If no, would patient like referral for services when discharged?: Yes  (What county?) Medical sales representative([place]) Does  patient have financial barriers related to discharge medications?: Yes Patient description of barriers related to discharge medications: limited income; no insurance  Summary/Recommendations:     Patient is a 31 year old male with a diagnosis of Major Depressive Disorder. Pt presented to the hospital with thoughts of suicide and increased depression. Pt reports primary trigger(s) for admission include homelessness and caregiving responsibilities for his parents. Patient will benefit from crisis stabilization, medication evaluation, group therapy and psycho education in addition to case management for discharge planning. At discharge it is recommended that Pt remain compliant with established discharge plan and continued treatment.   Verdene Lennert. 11/02/2016

## 2016-11-02 NOTE — Progress Notes (Signed)
Pt attended NA group this evening.  

## 2016-11-02 NOTE — Progress Notes (Signed)
Recreation Therapy Notes  Date: 11/02/16 Time: 0930 Location: [address] Dayroom  Group Topic: Stress Management  Goal Area(s) Addresses:  Patient will verbalize importance of using healthy stress management.  Patient will identify positive emotions associated with healthy stress management.   Intervention: Stress Management  Activity :  Meditation.  LRT introduced the stress management technique of meditation.  LRT played a meditation from the calm app to get patients engaged in the technique.  Patients were to follow along as the meditation played to participate.  Education:  Stress Management, Discharge Planning.   Education Outcome: Acknowledges edcuation/In group clarification offered/Needs additional education  Clinical Observations/Feedback: Pt did not attend group.   Caroll RancherMarjette Lelend Heinecke, LRT/CTRS         Lillia AbedLindsay, Jewelle Whitner A 11/02/2016 11:28 AM

## 2016-11-02 NOTE — [facility] Group Notes (Signed)
[facility] LCSW Group Therapy  11/02/2016 3:56 PM  Type of Therapy:  Group Therapy  Participation Level:  Did Not Attend-pt invited. Chose to remain in bed.   Summary of Progress/Problems: Today's Topic: Overcoming Obstacles. Patients identified one short term goal and potential obstacles in reaching this goal. Patients processed barriers involved in overcoming these obstacles. Patients identified steps necessary for overcoming these obstacles and explored motivation (internal and external) for facing these difficulties head on.   Don Vaughn N Smart LCSW 11/02/2016, 3:56 PM

## 2016-11-02 NOTE — [facility] Suicide Risk Assessment (Signed)
[facility]   Nursing information obtained from:  Patient Demographic factors:  NA Current Mental Status:  Suicidal ideation indicated by patient, Suicide plan, Plan includes specific time, place, or method, Self-harm thoughts Loss Factors:  Loss of significant relationship, Financial problems / change in socioeconomic status Historical Factors:  Family history of mental illness or substance abuse Risk Reduction Factors:  Sense of responsibility to family, Employed, Living with another person, especially a relative  Total Time spent with patient: 20 minutes Principal Problem: Major depressive disorder, single episode, severe without psychotic features (HCC) Diagnosis:   Patient Active Problem List   Diagnosis Date Noted  . Major depressive disorder, single episode, severe without psychotic features (HCC) [F32.2] 11/02/2016  . Stimulant use disorder [F15.90] 11/01/2016   Subjective Data: Please see H&P.   Continued Clinical Symptoms:  Alcohol Use Disorder Identification Test Final Score (AUDIT): 1 The "Alcohol Use Disorders Identification Test", Guidelines for Use in Primary Care, Second Edition.  World Science writerHealth Organization [facility](WHO). Score between 0-7:  no or low risk or alcohol related problems. Score between 8-15:  moderate risk of alcohol related problems. Score between 16-19:  high risk of alcohol related problems. Score 20 or above:  warrants further diagnostic evaluation for alcohol dependence and treatment.   CLINICAL FACTORS:   Depression:   Comorbid alcohol abuse/dependence Hopelessness Impulsivity   Musculoskeletal: Strength & Muscle Tone: within normal limits Gait & Station: normal Patient leans: N/A  Psychiatric Specialty Exam: Physical Exam  ROS  Blood pressure (!) 157/112, pulse 62, temperature 99.2 F (37.3 C), temperature source Oral, resp. rate 18, height 5\' 10"  (1.778 m), weight 110.1 kg (242 lb 11.6 oz).Body mass index is 34.83  kg/m.   Please see H&P.    COGNITIVE FEATURES THAT CONTRIBUTE TO RISK:  Closed-mindedness, Polarized thinking and Thought constriction (tunnel vision)    SUICIDE RISK:   Moderate:  Frequent suicidal ideation with limited intensity, and duration, some specificity in terms of plans, no associated intent, good self-control, limited dysphoria/symptomatology, some risk factors present, and identifiable protective factors, including available and accessible social support.  PLAN OF CARE: Please see H&P.   I certify that inpatient services furnished can reasonably be expected to improve the patient's condition.   November Sypher, MD 11/02/2016, 11:11 AM

## 2016-11-02 NOTE — Progress Notes (Signed)
Report about Pt received from Amanda RN. Pt at this time is in bed resting. 15-min safety checks continue. 

## 2016-11-03 DIAGNOSIS — F17210 Nicotine dependence, cigarettes, uncomplicated: Secondary | ICD-10-CM

## 2016-11-03 NOTE — Progress Notes (Signed)
[facility] MD Progress Note  11/03/2016 3:25 PM Don Vaughn  MRN:  [id number]  Subjective: TRUE reports, "I'm not well. I feel irritated. My mood is bad. I'm sure if my medicines are helping or not".  Objective: [name] is seen. Chart reviewed. He is lying down in his bed. He presents with a flat affect. He says his mood is bad. However, says he is trying to attend group sessions. Staff reports that patient is visible on the unit. Reports from staff indicated that although patient says he is in a bad mood, he is seen interacting well with peers smiles & laughs with the other patients. [name] says he is sleeping well. Currently denies any SIHI, AVH, delusional thoughts or paranoia. He does not appear to be responding to any internal stimuli. He is encouraged to continue to take his medications & give them time to get in his system. Support & encouragement provided. Denies any substance withdrawal symptoms.  Principal Problem: Major depressive disorder, single episode, severe without psychotic features (HCC)  Diagnosis:   Patient Active Problem List   Diagnosis Date Noted  . Major depressive disorder, single episode, severe without psychotic features (HCC) [F32.2] 11/02/2016  . Stimulant use disorder [F15.90] 11/01/2016   Total Time spent with patient: 25 minutes  Past Psychiatric History: Major depression.  Past Medical History:  Past Medical History:  Diagnosis Date  . Depression   . Hypertension     Past Surgical History:  Procedure Laterality Date  . EXTERNAL EAR SURGERY Right    "it was cutt off and sutured back on"   Family History:  Family History  Problem Relation Age of Onset  . Mental illness Neg Hx    Family Psychiatric  History: See H&P  Social History:  History  Alcohol Use No     History  Drug use: Unknown    Social History   Social History  . Marital status: Married    Spouse name: N/A  . Number of children: N/A  . Years of education: N/A   Social  History Main Topics  . Smoking status: Current Every Day Smoker  . Smokeless tobacco: Never Used  . Alcohol use No  . Drug use: Unknown  . Sexual activity: Not Asked   Other Topics Concern  . None   Social History Narrative  . None   Additional Social History:   Sleep: Good  Appetite:  Fair  Current Medications: Current Facility-Administered Medications  Medication Dose Route Frequency Provider Last Rate Last Dose  . acetaminophen (TYLENOL) tablet 650 mg  650 mg Oral Q6H PRN Okonkwo, [name], NP      . alum & mag hydroxide-simeth (MAALOX/MYLANTA) 200-200-20 MG/5ML suspension 30 mL  30 mL Oral Q4H PRN Okonkwo, [name], NP   30 mL at 11/02/16 2117  . amLODipine (NORVASC) tablet 5 mg  5 mg Oral Daily [name], PA-C   5 mg at 11/03/16 0931  . buPROPion [facility]) tablet 75 mg  75 mg Oral q morning - 10a [name], MD   75 mg at 11/03/16 0931  . cloNIDine (CATAPRES) tablet 0.1 mg  0.1 mg Oral BID [name], PA-C   0.1 mg at 11/03/16 0932  . hydrOXYzine (ATARAX/VISTARIL) tablet 25 mg  25 mg Oral TID PRN [name], NP   25 mg at 11/02/16 2159  . magnesium hydroxide (MILK OF MAGNESIA) suspension 30 mL  30 mL Oral Daily PRN [name], [name], NP      .  traZODone (DESYREL) tablet 50 mg  50 mg Oral QHS PRN Beryle Lathekonkwo, [name], NP   50 mg at 11/02/16 2159   Lab Results:  Results for orders placed or performed during the Vaughn encounter of 11/01/16 (from the past 48 hour(s))  Glucose, capillary     Status: None   Collection Time: 11/01/16  3:46 PM  Result Value Ref Range   Glucose-Capillary 83 65 - 99 mg/dL   Blood Alcohol level:  Lab Results  Component Value Date   ETH <5 10/31/2016   Metabolic Disorder Labs: No results found for: HGBA1C, MPG No results found for: PROLACTIN No results found for: CHOL, TRIG, HDL, CHOLHDL, VLDL, LDLCALC  Physical Findings: AIMS: Facial and Oral Movements Muscles of Facial Expression: None, normal Lips and  Perioral Area: None, normal Jaw: None, normal Tongue: None, normal,Extremity Movements Upper (arms, wrists, hands, fingers): None, normal Lower (legs, knees, ankles, toes): None, normal, Trunk Movements Neck, shoulders, hips: None, normal, Overall Severity Severity of abnormal movements (highest score from questions above): None, normal Incapacitation due to abnormal movements: None, normal Patient's awareness of abnormal movements (rate only patient's report): No Awareness, Dental Status Current problems with teeth and/or dentures?: No Does patient usually wear dentures?: No  CIWA:    COWS:     Musculoskeletal: Strength & Muscle Tone: within normal limits Gait & Station: normal Patient leans: Right  Psychiatric Specialty Exam: Physical Exam  Review of Systems  Psychiatric/Behavioral: Positive for depression ("My mood is bad"). Negative for hallucinations, memory loss, substance abuse and suicidal ideas. The patient is nervous/anxious and has insomnia.     Blood pressure (!) 118/41, pulse 69, temperature 98.4 F (36.9 C), resp. rate 16, height 5\' 10"  (1.778 m), weight 110.1 kg (242 lb 11.6 oz).Body mass index is 34.83 kg/m.  General Appearance: Casual  Eye Contact:  Fair  Speech:  Clear and Coherent  Volume:  Normal  Mood:  Depressed and Dysphoric  Affect:  Appropriate  Thought Process:  Goal Directed and Descriptions of Associations: Intact  Orientation:  Full (Time, Place, and Person)  Thought Content:  Rumination  Suicidal Thoughts:  Yes.  with intent/plan  Homicidal Thoughts:  No  Memory:  Immediate;   Fair Recent;   Fair Remote;   Fair  Judgement:  Impaired  Insight:  Shallow  Psychomotor Activity:  Decreased  Concentration:  Concentration: Poor and Attention Span: Poor  Recall:  Poor  Fund of Knowledge:  Fair  Language:  Fair  Akathisia:  No  Handed:  Right  AIMS (if indicated):     Assets:  Communication Skills Desire for Improvement  ADL's:  Intact   Cognition:  WNL  Sleep:  Number of Hours: 8     Treatment Plan Summary: Patient continues to require treatment for depression. No evidence of psychosis. No evidence of mania. No dangerousness to self or others.Marland Kitchen.     Psychiatric: Major depressive disorder, recurrent.  Medical: Continue Clonidine 0.1 mg bid. Continue Norvasc 5 mg daily.  Psychosocial:  Will encourage group counseling attendance & participation.  PLAN: 1.11-03-16: No changes made on the current plan of care, continue current regimen as recommended.  Depression: Continue Wellbutrin 75 mg daily.  Anxiety: Continue Hydroxyzine 25 mg prn.  Insomnia: Continue Trazodone 50 mg prn Q hs.   Nicotine Withdrawal symptoms: Will continue the nicotine gum.  2. Continue to monitor mood, behavior and interaction with peers  Sanjuana KavaNwoko, Agnes I, NP, PMHNP, FNP-BC 11/03/2016, 3:25 PM  Agree with NP Progress Note

## 2016-11-03 NOTE — Progress Notes (Signed)
DAR NOTE: Patient presents with flat affect and depressed mood. Pt spent most of his am in the bed, pt initially  refused his am medications, but came back later at lunch time requesting to take them.  Pt has been seen in the dayroom interacting with peers laughing and joking around. Denies pain, auditory and visual hallucinations.  Rates depression at 8, hopelessness at 3, and anxiety at 7.  Maintained on routine safety checks.  Medications given as prescribed.  Support and encouragement offered as needed. Offered no complaint.

## 2016-11-03 NOTE — [facility] Group Notes (Signed)
[facility] LCSW Group Therapy  11/03/2016 3:35 PM  Type of Therapy:  Group Therapy  Participation Level:  Did Not Attend-pt invited. Chose to remain in bed.   Summary of Progress/Problems:  Finding Balance in Life. Today's group focused on defining balance in one's own words, identifying things that can knock one off balance, and exploring healthy ways to maintain balance in life. Group members were asked to provide an example of a time when they felt off balance, describe how they handled that situation,and process healthier ways to regain balance in the future. Group members were asked to share the most important tool for maintaining balance that they [facility] and how they plan to apply this method after discharge.   Alysen Smylie N Smart LCSW 11/03/2016, 3:35 PM

## 2016-11-03 NOTE — Progress Notes (Signed)
D    Pt endorses depression and anxiety    His behaviors are appropriate with others and he has been compliant with treatment     A    Verbal support given   Medications administered and effectiveness monitored    Q 15 min checks R    Pt is safe at present time

## 2016-11-03 NOTE — Progress Notes (Signed)
Pt animated in affect and pleasant but sarcastic at times in mood. Pt's bp was elevated this evening. Pt stated he had not been restarted on his bp medications. Provider was informed and medications restarted. Pt complained of gas, anxiety, and insomnia. Pt was observed laughing and interacting with peers in dayroom and hallway. Pt denied SI/HI/AVH and contracted for safety.

## 2016-11-03 NOTE — [facility] Group Notes (Signed)
The focus of this group is to educate the patient on the purpose and policies of crisis stabilization and provide a format to answer questions about their admission.  The group details unit policies and expectations of patients while admitted.  Patient did not attend 0900 nurse education orientation group this morning.  Patient stayed in bed.   

## 2016-11-04 NOTE — [facility] Group Notes (Signed)
[facility] LCSW Group Therapy  11/04/2016 1:26 PM  Type of Therapy:  Group Therapy  Participation Level:  Minimal  Participation Quality:  Attentive  Affect:  Appropriate  Cognitive:  Alert and Oriented  Insight:  Improving  Engagement in Therapy:  Engaged  Modes of Intervention:  Confrontation, Discussion, Education, Socialization and Support  Summary of Progress/Problems: Feelings around Relapse. Group members discussed the meaning of relapse and shared personal stories of relapse, how it affected them and others, and how they perceived themselves during this time. Group members were encouraged to identify triggers, warning signs and coping skills used when facing the possibility of relapse. Social supports were discussed and explored in detail. Post Acute Withdrawal Syndrome (handout provided) was introduced and examined. Pt's were encouraged to ask questions, talk about key points associated with PAWS, and process this information in terms of relapse prevention.   Beckie Viscardi N Smart LCSW 11/04/2016, 1:26 PM

## 2016-11-04 NOTE — Progress Notes (Signed)
D:Pt was very irritable this morning refusing vital signs and medication. When writer went to his room to introduce self and called his name, pt responded that he was not Don Vaughn. Writer attempted to talk with pt later in the day and pt apologized for being irritable. 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 A:Offered support, encouragement and 15 minute checks. R:Pt denies si and hi. Safety maintained on the unit.

## 2016-11-04 NOTE — Progress Notes (Signed)
D    Pt endorses depression and anxiety    His behaviors are appropriate with others and he has been compliant with treatment    Pt said he was ready to go and he wasn't getting any help here   He said medications should be changed up so it will be fun and he claims to not have seen a doctor in 2 days A    Verbal support given   Medications administered and effectiveness monitored    Q 15 min checks   Pt was informed he did see the NP R    Pt is safe at present time

## 2016-11-04 NOTE — Progress Notes (Signed)
Recreation Therapy Notes  Date: 11/04/16 Time: 0930 Location: 300 [address] Dayroom  Group Topic: Stress Management  Goal Area(s) Addresses:  Patient will verbalize importance of using healthy stress management.  Patient will identify positive emotions associated with healthy stress management.   Intervention: Stress Management  Activity :  Progressive Muscle Relaxation.  LRT introduced the stress management technique of progressive muscle relaxation.  LRT read a script that guided patients through steps of tensing and relaxing each muscle group.  Patients were to follow along as LRT read script to fully participate in the technique.  Education:  Stress Management, Discharge Planning.   Education Outcome: Acknowledges edcuation/In group clarification offered/Needs additional education  Clinical Observations/Feedback: Pt did not attend group.   Caroll RancherMarjette Dashayla Theissen, LRT/CTRS         Lillia AbedLindsay, Tiffiany Beadles A 11/04/2016 11:13 AM

## 2016-11-04 NOTE — Plan of Care (Signed)
Problem: Health Behavior/Discharge Planning: Goal: Compliance with treatment plan for underlying cause of condition will improve Outcome: Not Progressing Pt is labile refusing treatment at times.

## 2016-11-04 NOTE — Progress Notes (Signed)
[facility] MD Progress Note  11/04/2016 12:25 PM Don Vaughn  MRN:  [id number]  Subjective: Don Vaughn reports, "I'm not feeling well. I don't think you can help me."   Objective: Face to face evaluation completed and chart reviewed. During thi evaluation patient appears very irritable.  He is noted lying down in his bed and refuses to look at writer during the assessment. His eyes remained closed throughout the evaluation.  He continues to  present with a flat affect and depressed mood. He notes that he doesn't feel well yet declined to provide specific details. As per staff, patient has spent most of his time I bed. Patient reports that he has not participated in group sessions today. It appears that he has not taken his medications. He does not appear fully vested in treatment. Reports sleeping and eating well without difficulties.  Currently denies any SIHI, AVH, delusional thoughts or paranoia. He does not appear to be responding to any internal stimuli. Denies any substance withdrawal symptoms. At this time, he does contract for safety on the unit.  Principal Problem: Major depressive disorder, single episode, severe without psychotic features (HCC)  Diagnosis:   Patient Active Problem List   Diagnosis Date Noted  . Major depressive disorder, single episode, severe without psychotic features (HCC) [F32.2] 11/02/2016  . Stimulant use disorder [F15.90] 11/01/2016   Total Time spent with patient: 25 minutes  Past Psychiatric History: Major depression.  Past Medical History:  Past Medical History:  Diagnosis Date  . Depression   . Hypertension     Past Surgical History:  Procedure Laterality Date  . EXTERNAL EAR SURGERY Right    "it was cutt off and sutured back on"   Family History:  Family History  Problem Relation Age of Onset  . Mental illness Neg Hx    Family Psychiatric  History: See H&P  Social History:  History  Alcohol Use No     History  Drug use: Unknown    Social  History   Social History  . Marital status: Married    Spouse name: N/A  . Number of children: N/A  . Years of education: N/A   Social History Main Topics  . Smoking status: Current Every Day Smoker  . Smokeless tobacco: Never Used  . Alcohol use No  . Drug use: Unknown  . Sexual activity: Not Asked   Other Topics Concern  . None   Social History Narrative  . None   Additional Social History:   Sleep: Good  Appetite:  Fair  Current Medications: Current Facility-Administered Medications  Medication Dose Route Frequency Provider Last Rate Last Dose  . acetaminophen (TYLENOL) tablet 650 mg  650 mg Oral Q6H PRN Okonkwo, [name], NP      . alum & mag hydroxide-simeth (MAALOX/MYLANTA) 200-200-20 MG/5ML suspension 30 mL  30 mL Oral Q4H PRN Okonkwo, [name], NP   30 mL at 11/02/16 2117  . amLODipine (NORVASC) tablet 5 mg  5 mg Oral Daily [name], PA-C   5 mg at 11/03/16 0931  . buPROPion [facility]) tablet 75 mg  75 mg Oral q morning - 10a [name], MD   75 mg at 11/03/16 0931  . cloNIDine (CATAPRES) tablet 0.1 mg  0.1 mg Oral BID [name], PA-C   0.1 mg at 11/03/16 1734  . hydrOXYzine (ATARAX/VISTARIL) tablet 25 mg  25 mg Oral TID PRN [name], NP   25 mg at 11/03/16 2210  . magnesium hydroxide (MILK  OF MAGNESIA) suspension 30 mL  30 mL Oral Daily PRN Okonkwo, [name], NP      . traZODone (DESYREL) tablet 50 mg  50 mg Oral QHS PRN Okonkwo, [name], NP   50 mg at 11/03/16 2210   Lab Results:  No results found for this or any previous visit (from the past 48 hour(s)). Blood Alcohol level:  Lab Results  Component Value Date   ETH <5 10/31/2016   Metabolic Disorder Labs: No results found for: HGBA1C, MPG No results found for: PROLACTIN No results found for: CHOL, TRIG, HDL, CHOLHDL, VLDL, LDLCALC  Physical Findings: AIMS: Facial and Oral Movements Muscles of Facial Expression: None, normal Lips and Perioral Area: None, normal Jaw:  None, normal Tongue: None, normal,Extremity Movements Upper (arms, wrists, hands, fingers): None, normal Lower (legs, knees, ankles, toes): None, normal, Trunk Movements Neck, shoulders, hips: None, normal, Overall Severity Severity of abnormal movements (highest score from questions above): None, normal Incapacitation due to abnormal movements: None, normal Patient's awareness of abnormal movements (rate only patient's report): No Awareness, Dental Status Current problems with teeth and/or dentures?: No Does patient usually wear dentures?: No  CIWA:    COWS:     Musculoskeletal: Strength & Muscle Tone: within normal limits Gait & Station: normal Patient leans: Right  Psychiatric Specialty Exam: Physical Exam  Nursing note and vitals reviewed. Constitutional: He is oriented to person, place, and time.  Neurological: He is alert and oriented to person, place, and time.    Review of Systems  Psychiatric/Behavioral: Positive for depression ("My mood is bad"). Negative for hallucinations, memory loss, substance abuse and suicidal ideas. The patient is nervous/anxious and has insomnia.   All other systems reviewed and are negative.   Blood pressure (!) 149/85, pulse 64, temperature 98.4 F (36.9 C), resp. rate 16, height 5\' 10"  (1.778 m), weight 242 lb 11.6 oz (110.1 kg).Body mass index is 34.83 kg/m.  General Appearance: Casual  Eye Contact:  Fair  Speech:  Clear and Coherent  Volume:  Normal  Mood:  Depressed and Dysphoric  Affect:  Flat  Thought Process:  Goal Directed and Descriptions of Associations: Intact  Orientation:  Full (Time, Place, and Person)  Thought Content:  Rumination  Suicidal Thoughts: Denies SI with plan or intent   Homicidal Thoughts:  No  Memory:  Immediate;   Fair Recent;   Fair Remote;   Fair  Judgement:  Impaired  Insight:  Shallow, lacking   Psychomotor Activity:  Decreased  Concentration:  Concentration: Poor and Attention Span: Poor  Recall:   Poor  Fund of Knowledge:  Fair  Language:  Fair  Akathisia:  No  Handed:  Right  AIMS (if indicated):     Assets:  Communication Skills Desire for Improvement  ADL's:  Intact  Cognition:  WNL  Sleep:  Number of Hours: 8     Treatment Plan Summary: Reviewed current treatment plan. Patient continues to present with a depressed mood and flat affect. No evidence of psychosis. No evidence of mania. No dangers to self or others. To continue to reduce current symptoms to base line and improve the patient's overall level of functioning will continue the following treatment plan without adjustments;    Psychiatric: Major depressive disorder, recurrent.  Medical: Continue Clonidine 0.1 mg bid. Continue Norvasc 5 mg daily.  Psychosocial:  Will encourage group counseling attendance & participation.  PLAN: 1.11-04-16  Depression: Continue Wellbutrin 75 mg daily.  Anxiety: Continue Hydroxyzine 25 mg prn.  Insomnia:  Continue Trazodone 50 mg prn Q hs.   Nicotine Withdrawal symptoms: Will continue the nicotine gum.  2. Continue to monitor mood, behavior and interaction with peers  Denzil Magnuson, NP, PMHNP, FNP-BC 11/04/2016, 12:25 PM   Patient ID: Trust Leh, male   DOB: 1986-05-01, 31 y.o.   MRN: [id number] Agree with NP Progress Note

## 2016-11-05 DIAGNOSIS — F17203 Nicotine dependence unspecified, with withdrawal: Secondary | ICD-10-CM

## 2016-11-05 DIAGNOSIS — G4700 Insomnia, unspecified: Secondary | ICD-10-CM

## 2016-11-05 DIAGNOSIS — F1590 Other stimulant use, unspecified, uncomplicated: Secondary | ICD-10-CM

## 2016-11-05 DIAGNOSIS — F322 Major depressive disorder, single episode, severe without psychotic features: Principal | ICD-10-CM

## 2016-11-05 MED ORDER — BUPROPION HCL 100 MG PO TABS
100.0000 mg | ORAL_TABLET | Freq: Every morning | ORAL | Status: DC
  Filled 2016-11-05 (×4): qty 1

## 2016-11-05 MED ORDER — TRAZODONE HCL 50 MG PO TABS
50.0000 mg | ORAL_TABLET | Freq: Every evening | ORAL | Status: DC | PRN
  Administered 2016-11-05 – 2016-11-06 (×3): 50 mg via ORAL
  Filled 2016-11-05 (×8): qty 1

## 2016-11-05 NOTE — [facility] Group Notes (Signed)
[facility] LCSW Group Therapy Note  11/21/2015 at 10:15 AM  Type of Therapy and Topic:  Group Therapy: Avoiding Self-Sabotaging and Enabling Behaviors  Participation Level:  Did Not Attend despite overhead announcement pt chose to remain in bed   Summary of Progress/Problems:  The main focus of today's process group was for the patient to identify ways in which they have in the past perhaps sabotaged their own recovery. Motivational Interviewing was utilized to identify what might be a sign they were getting better and what might motivate them to become more engaged with change.    Carney Bernatherine C Omar Orrego, LCSW

## 2016-11-05 NOTE — Progress Notes (Signed)
[facility] MD Progress Note  11/05/2016 1:28 PM Don Vaughn  MRN:  [id number]  Subjective: Don Vaughn reports depression, remains isolative  Objective: Face to face evaluation completed and chart reviewed. During thi evaluation patient appears very irritable.   He stayed in bed did not wanted to talk much. Endorses feeling down and remained isolative Denies hallucinations. Taking meds.  Energy level low Endorses fatigue  Principal Problem: Major depressive disorder, single episode, severe without psychotic features (HCC)  Diagnosis:   Patient Active Problem List   Diagnosis Date Noted  . Major depressive disorder, single episode, severe without psychotic features (HCC) [F32.2] 11/02/2016  . Stimulant use disorder [F15.90] 11/01/2016   Total Time spent with patient: 25 minutes  Past Psychiatric History: Major depression.  Past Medical History:  Past Medical History:  Diagnosis Date  . Depression   . Hypertension     Past Surgical History:  Procedure Laterality Date  . EXTERNAL EAR SURGERY Right    "it was cutt off and sutured back on"   Family History:  Family History  Problem Relation Age of Onset  . Mental illness Neg Hx    Family Psychiatric  History: See H&P  Social History:  History  Alcohol Use No     History  Drug use: Unknown    Social History   Social History  . Marital status: Married    Spouse name: N/A  . Number of children: N/A  . Years of education: N/A   Social History Main Topics  . Smoking status: Current Every Day Smoker  . Smokeless tobacco: Never Used  . Alcohol use No  . Drug use: Unknown  . Sexual activity: Not Asked   Other Topics Concern  . None   Social History Narrative  . None   Additional Social History:   Sleep: Good  Appetite:  Fair  Current Medications: Current Facility-Administered Medications  Medication Dose Route Frequency Provider Last Rate Last Dose  . acetaminophen (TYLENOL) tablet 650 mg  650 mg Oral Q6H  PRN Okonkwo, [name], NP      . alum & mag hydroxide-simeth (MAALOX/MYLANTA) 200-200-20 MG/5ML suspension 30 mL  30 mL Oral Q4H PRN Okonkwo, [name], NP   30 mL at 11/02/16 2117  . amLODipine (NORVASC) tablet 5 mg  5 mg Oral Daily [name], PA-C   Stopped at 11/05/16 0800  . [START ON 11/06/2016] buPROPion (WELLBUTRIN) tablet 100 mg  100 mg Oral q morning - 10a Thresa Ross, MD      . cloNIDine (CATAPRES) tablet 0.1 mg  0.1 mg Oral BID [name], PA-C   Stopped at 11/05/16 0800  . hydrOXYzine (ATARAX/VISTARIL) tablet 25 mg  25 mg Oral TID PRN [name], [name], NP   25 mg at 11/04/16 2231  . magnesium hydroxide (MILK OF MAGNESIA) suspension 30 mL  30 mL Oral Daily PRN Okonkwo, [name], NP      . traZODone (DESYREL) tablet 50 mg  50 mg Oral QHS PRN Okonkwo, [name], NP   50 mg at 11/04/16 2231   Lab Results:  No results found for this or any previous visit (from the past 48 hour(s)). Blood Alcohol level:  Lab Results  Component Value Date   ETH <5 10/31/2016   Metabolic Disorder Labs: No results found for: HGBA1C, MPG No results found for: PROLACTIN No results found for: CHOL, TRIG, HDL, CHOLHDL, VLDL, LDLCALC  Physical Findings: AIMS: Facial and Oral Movements Muscles of Facial Expression: None, normal Lips  and Perioral Area: None, normal Jaw: None, normal Tongue: None, normal,Extremity Movements Upper (arms, wrists, hands, fingers): None, normal Lower (legs, knees, ankles, toes): None, normal, Trunk Movements Neck, shoulders, hips: None, normal, Overall Severity Severity of abnormal movements (highest score from questions above): None, normal Incapacitation due to abnormal movements: None, normal Patient's awareness of abnormal movements (rate only patient's report): No Awareness, Dental Status Current problems with teeth and/or dentures?: No Does patient usually wear dentures?: No  CIWA:    COWS:     Musculoskeletal: Strength & Muscle Tone: within  normal limits Gait & Station: normal Patient leans: Right  Psychiatric Specialty Exam: Physical Exam  Nursing note and vitals reviewed. Constitutional: He is oriented to person, place, and time.  Neurological: He is alert and oriented to person, place, and time.    Review of Systems  Cardiovascular: Negative for chest pain.  Psychiatric/Behavioral: Positive for depression ("My mood is bad"). Negative for hallucinations, memory loss, substance abuse and suicidal ideas. The patient is nervous/anxious and has insomnia.   All other systems reviewed and are negative.   Blood pressure 128/68, pulse (!) 56, temperature 98.4 F (36.9 C), resp. rate 16, height 5\' 10"  (1.778 m), weight 110.1 kg (242 lb 11.6 oz).Body mass index is 34.83 kg/m.  General Appearance: Casual  Eye Contact:  Fair  Speech:  Clear and Coherent  Volume:  Normal  Mood:  dysphoric  Affect:  congruent  Thought Process:  Goal Directed and Descriptions of Associations: Intact  Orientation:  Full (Time, Place, and Person)  Thought Content:  Rumination  Suicidal Thoughts: Denies SI with plan or intent   Homicidal Thoughts:  No  Memory:  Immediate;   Fair Recent;   Fair Remote;   Fair  Judgement:  Impaired  Insight:  Shallow, lacking   Psychomotor Activity:  Decreased  Concentration:  Concentration: Poor and Attention Span: Poor  Recall:  Poor  Fund of Knowledge:  Fair  Language:  Fair  Akathisia:  No  Handed:  Right  AIMS (if indicated):     Assets:  Communication Skills Desire for Improvement  ADL's:  Intact  Cognition:  WNL  Sleep:  Number of Hours: 8      Psychiatric: Major depressive disorder, recurrent.  Medical: Continue Clonidine 0.1 mg bid. Continue Norvasc 5 mg daily.  Psychosocial:  Will encourage group counseling attendance & participation.  PLAN: 1.11-04-16  Depression: Not improved.  Increase wellbutrin to 100mg    Anxiety: fluctuates  Continue Hydroxyzine 25 mg  prn.  Insomnia: Continue Trazodone 50 mg prn Q hs.   Nicotine Withdrawal symptoms: Will continue the nicotine gum.  2. Continue to monitor mood, behavior and interaction with peers  Jovoni Borkenhagen, MD,11/05/2016, 1:28 PM

## 2016-11-05 NOTE — Progress Notes (Signed)
D    Pt endorses depression and anxiety    His behaviors are appropriate with others and he has been compliant with treatment   Pt mood is close to being euphoric and he was making bizarre delusional statements while at the medication window  Pt said he was ready to go and he wasn't getting any help here   He said medications should be changed up so it will be fun and he claims to not have seen a doctor in 2 days A    Verbal support given   Medications administered and effectiveness monitored    Q 15 min checks   Pt was informed he did see the NP   Increased dose of trazadone as per request by patient R    Pt is safe at present time

## 2016-11-05 NOTE — Progress Notes (Signed)
NSG 7a-7p shift:   D:  Pt. Had been irritable, somnolent, and isolative this morning, stating that he did not sleep well and was just "too tired" to take his morning medications.  He agreed to take his daily medications as well as his evening dose of clonidine at ~ 1700.  He requests that his daily medications be rescheduled for 1700.  He was more visible on the unit and interacted more with peers and staff this afternoon/evening.  Pt's Goal today is to work on his depression.   A: Support, education, and encouragement provided as needed.  Level 3 checks continued for safety.  R: Pt. receptive to intervention/s.  Safety maintained.  Joaquin MusicMary Rosalyn Archambault, RN

## 2016-11-05 NOTE — Progress Notes (Signed)
[facility] Group Notes:  (Nursing/MHT/Case Management/Adjunct)  Date:  11/05/2016  Time: 2045 Type of Therapy:  wrap up group  Participation Level:  Active  Participation Quality:  Appropriate, Attentive, Sharing and Supportive  Affect:  Appropriate  Cognitive:  Appropriate  Insight:  Improving  Engagement in Group:  Supportive  Modes of Intervention:  Clarification, Education and Support  Summary of Progress/Problems:Pt shared that he connected with a peer patient today talking about music. Pt shared that he may up and move to [place] for a change. Pt reports no family or supports there.  Marcille BuffyMcNeil, Thelton Graca S 11/05/2016, 9:35 PM

## 2016-11-06 MED ORDER — BUPROPION HCL 100 MG PO TABS
100.0000 mg | ORAL_TABLET | Freq: Two times a day (BID) | ORAL | Status: DC
  Administered 2016-11-06: 100 mg via ORAL
  Filled 2016-11-06 (×6): qty 1

## 2016-11-06 NOTE — Progress Notes (Signed)
Patient ID: Don ShockMichael Vaughn, male   DOB: 1985-08-10, 31 y.o.   MRN: [id number] Patient refused vital signs.  Went into his room and advised his medications were ready.  Patient waved staff member away.  Patient was asked if he was refusing his medications and he nodded yes and waved staff away again.  Medications documented as refused.

## 2016-11-06 NOTE — [facility] Group Notes (Signed)
[facility] LCSW Group Therapy  11/06/2016   Type of Therapy:  Group Therapy  Participation Level:  Did not Attend  Takota Cahalan J Tarun Patchell MSW, LCSW  

## 2016-11-06 NOTE — Progress Notes (Signed)
Patient did attend the evening speaker AA meeting.  

## 2016-11-06 NOTE — Progress Notes (Signed)
Pt refused vital signs.

## 2016-11-06 NOTE — Progress Notes (Signed)
[facility] MD Progress Note  11/06/2016 1:45 PM Lewis ShockMichael Willemsen  MRN:  [id number]  Subjective: Casimiro NeedleMichael reports depression, remains isolative  Objective: Face to face evaluation completed and chart reviewed.  Remains isolative and dsyphoric, does not communicate much and prefers bed Sleep fair' Energy low Tolerating wellbutrin wants to change to later part of day  Principal Problem: Major depressive disorder, single episode, severe without psychotic features (HCC)  Diagnosis:   Patient Active Problem List   Diagnosis Date Noted  . Major depressive disorder, single episode, severe without psychotic features (HCC) [F32.2] 11/02/2016  . Stimulant use disorder [F15.90] 11/01/2016   Total Time spent with patient: 25 minutes  Past Psychiatric History: Major depression.  Past Medical History:  Past Medical History:  Diagnosis Date  . Depression   . Hypertension     Past Surgical History:  Procedure Laterality Date  . EXTERNAL EAR SURGERY Right    "it was cutt off and sutured back on"   Family History:  Family History  Problem Relation Age of Onset  . Mental illness Neg Hx    Family Psychiatric  History: See H&P  Social History:  History  Alcohol Use No     History  Drug use: Unknown    Social History   Social History  . Marital status: Married    Spouse name: N/A  . Number of children: N/A  . Years of education: N/A   Social History Main Topics  . Smoking status: Current Every Day Smoker  . Smokeless tobacco: Never Used  . Alcohol use No  . Drug use: Unknown  . Sexual activity: Not Asked   Other Topics Concern  . None   Social History Narrative  . None   Additional Social History:   Sleep: Good  Appetite:  Fair  Current Medications: Current Facility-Administered Medications  Medication Dose Route Frequency Provider Last Rate Last Dose  . acetaminophen (TYLENOL) tablet 650 mg  650 mg Oral Q6H PRN Okonkwo, [name], NP      . alum & mag hydroxide-simeth  (MAALOX/MYLANTA) 200-200-20 MG/5ML suspension 30 mL  30 mL Oral Q4H PRN Okonkwo, [name], NP   30 mL at 11/02/16 2117  . amLODipine (NORVASC) tablet 5 mg  5 mg Oral Daily Kerry HoughSimon, Spencer E, PA-C   5 mg at 11/05/16 1619  . buPROPion [facility](WELLBUTRIN) tablet 100 mg  100 mg Oral q morning - 10a Thresa RossAkhtar, Mckinzie Saksa, MD      . cloNIDine (CATAPRES) tablet 0.1 mg  0.1 mg Oral BID Kerry HoughSimon, Spencer E, PA-C   0.1 mg at 11/05/16 1618  . hydrOXYzine (ATARAX/VISTARIL) tablet 25 mg  25 mg Oral TID PRN Ferne Reuskonkwo, [name], NP   25 mg at 11/05/16 2200  . magnesium hydroxide (MILK OF MAGNESIA) suspension 30 mL  30 mL Oral Daily PRN Okonkwo, [name], NP      . traZODone (DESYREL) tablet 50 mg  50 mg Oral QHS,MR X 1 Nira ConnBerry, Jason A, NP   50 mg at 11/05/16 2218   Lab Results:  No results found for this or any previous visit (from the past 48 hour(s)). Blood Alcohol level:  Lab Results  Component Value Date   ETH <5 10/31/2016   Metabolic Disorder Labs: No results found for: HGBA1C, MPG No results found for: PROLACTIN No results found for: CHOL, TRIG, HDL, CHOLHDL, VLDL, LDLCALC  Physical Findings: AIMS: Facial and Oral Movements Muscles of Facial Expression: None, normal Lips and Perioral Area: None, normal Jaw: None, normal Tongue:  None, normal,Extremity Movements Upper (arms, wrists, hands, fingers): None, normal Lower (legs, knees, ankles, toes): None, normal, Trunk Movements Neck, shoulders, hips: None, normal, Overall Severity Severity of abnormal movements (highest score from questions above): None, normal Incapacitation due to abnormal movements: None, normal Patient's awareness of abnormal movements (rate only patient's report): No Awareness, Dental Status Current problems with teeth and/or dentures?: No Does patient usually wear dentures?: No  CIWA:    COWS:     Musculoskeletal: Strength & Muscle Tone: within normal limits Gait & Station: normal Patient leans: Right  Psychiatric Specialty  Exam: Physical Exam  Nursing note and vitals reviewed. Constitutional: He is oriented to person, place, and time.  Neurological: He is alert and oriented to person, place, and time.    Review of Systems  Cardiovascular: Negative for chest pain.  Psychiatric/Behavioral: Positive for depression ("My mood is bad"). Negative for hallucinations, memory loss, substance abuse and suicidal ideas. The patient is nervous/anxious and has insomnia.   All other systems reviewed and are negative.   Blood pressure 128/68, pulse (!) 56, temperature 98.4 F (36.9 C), resp. rate 16, height 5\' 10"  (1.778 m), weight 110.1 kg (242 lb 11.6 oz).Body mass index is 34.83 kg/m.  General Appearance: Casual  Eye Contact:  Fair  Speech:  Clear and Coherent  Volume:  Normal  Mood:  dysphoric  Affect:  congruent  Thought Process:  Goal Directed and Descriptions of Associations: Intact  Orientation:  Full (Time, Place, and Person)  Thought Content:  Rumination  Suicidal Thoughts: Denies SI with plan or intent   Homicidal Thoughts:  No  Memory:  Immediate;   Fair Recent;   Fair Remote;   Fair  Judgement:  Impaired  Insight:  Shallow, lacking   Psychomotor Activity:  Decreased  Concentration:  Concentration: Poor and Attention Span: Poor  Recall:  Poor  Fund of Knowledge:  Fair  Language:  Fair  Akathisia:  No  Handed:  Right  AIMS (if indicated):     Assets:  Communication Skills Desire for Improvement  ADL's:  Intact  Cognition:  WNL  Sleep:  Number of Hours: 8      Psychiatric: Major depressive disorder, recurrent.  Medical: Continue Clonidine 0.1 mg bid. Continue Norvasc 5 mg daily.  Psychosocial:  Will encourage group counseling attendance & participation.  PLAN: 1.11-04-16  Depression: Not improved.  Increase wellbutrin to 100mg  bid   Anxiety: fluctuates  Continue hydroxyzine  Insomnia: Continue Trazodone 50 mg prn Q hs.   Nicotine Withdrawal symptoms: Will continue the  nicotine gum.  2. Continue to monitor mood, behavior and interaction with peers  Leilanee Righetti, MD,11/06/2016, 1:45 PM

## 2016-11-06 NOTE — Plan of Care (Signed)
Problem: Activity: Goal: Interest or engagement in activities will improve Outcome: Not Progressing Patient remains isolative and withdrawn; has been encouraged to attend groups.

## 2016-11-06 NOTE — Progress Notes (Signed)
Patient ID: Don Vaughn, male   DOB: 10-23-1985, 31 y.o.   MRN: [id number] D: Patient remains isolative and withdrawn.  He refused his medications this morning and refused to fill out his inventory sheet.  Patient has not been up to med window to ask for his medications.  He has been down for meals and is observed in the day room interacting with his peers.  He is laughing and joking with his peers.  Patient is dressed in scrubs.  His affect is appropriate.  His mood appears stable.  He denies any thoughts of self harm.  He does not appear to be responding to internal stimuli. A: Continue to monitor medication management and MD orders.  Safety checks completed every 15 minutes per protocol.  Offer support and encouragement as needed. R: Patient remains isolative mostly to his room.  He has not attended any groups today.

## 2016-11-06 NOTE — [facility] Group Notes (Signed)
[facility] Group Notes:  (Nursing/MHT/Case Management/Adjunct)  Date:  11/06/2016  Time:  0900 am  Type of Therapy:  Psychoeducational Skills  Participation Level:  Did Not Attend  Patient invited; declined to attend.  Don Vaughn 11/06/2016, 2:45 PM 

## 2016-11-07 ENCOUNTER — Encounter (HOSPITAL_COMMUNITY): Payer: Self-pay | Admitting: Behavioral Health

## 2016-11-07 MED ORDER — CLONIDINE HCL 0.1 MG PO TABS: 0.1000 mg | ORAL_TABLET | Freq: Two times a day (BID) | ORAL | 11 refills | Status: DC

## 2016-11-07 MED ORDER — BUPROPION HCL ER (XL) 150 MG PO TB24
150.0000 mg | ORAL_TABLET | Freq: Every day | ORAL | Status: DC
  Administered 2016-11-08: 150 mg via ORAL
  Filled 2016-11-07: qty 1
  Filled 2016-11-07: qty 7
  Filled 2016-11-07: qty 1

## 2016-11-07 MED ORDER — BUPROPION HCL 100 MG PO TABS: 100.0000 mg | ORAL_TABLET | Freq: Two times a day (BID) | ORAL | 0 refills | Status: DC

## 2016-11-07 MED ORDER — AMLODIPINE BESYLATE 5 MG PO TABS: 5.0000 mg | ORAL_TABLET | Freq: Every day | ORAL | 0 refills | Status: DC

## 2016-11-07 MED ORDER — TRAZODONE HCL 50 MG PO TABS: 50.0000 mg | ORAL_TABLET | Freq: Every evening | ORAL | 0 refills | Status: DC | PRN

## 2016-11-07 MED ORDER — HYDROXYZINE HCL 25 MG PO TABS: 25.0000 mg | ORAL_TABLET | Freq: Three times a day (TID) | ORAL | 0 refills | Status: DC | PRN

## 2016-11-07 MED ORDER — TRAZODONE HCL 150 MG PO TABS
150.0000 mg | ORAL_TABLET | Freq: Every day | ORAL | Status: DC
  Administered 2016-11-07: 150 mg via ORAL
  Filled 2016-11-07: qty 1
  Filled 2016-11-07: qty 7
  Filled 2016-11-07 (×2): qty 1

## 2016-11-07 NOTE — Plan of Care (Signed)
Problem: Activity: Goal: Interest or engagement in activities will improve Outcome: Progressing Patient is not actively participating in his treatment; he is not participating in group.

## 2016-11-07 NOTE — Progress Notes (Signed)
[facility] MD Progress Note  11/07/2016 3:24 PM Don Vaughn  MRN:  [id number] Subjective:  Patient states " I am not doing well, I did not sleep at all last night."  Objective:Patient seen and chart reviewed.Discussed patient with treatment team.  Pt today seen in bed , poor eye contact , response to questions in monosyllables. Pt reports he is not ready to be discharged , reports his medications are not right and he still struggles with sleep issues. Pt per RN , has not been consistent with his report of sx and per report he was making progress. Continue to observe .   Principal Problem: Major depressive disorder, single episode, severe without psychotic features (HCC) Diagnosis:   Patient Active Problem List   Diagnosis Date Noted  . Major depressive disorder, single episode, severe without psychotic features (HCC) [F32.2] 11/02/2016  . Stimulant use disorder [F15.90] 11/01/2016   Total Time spent with patient: 20 minutes  Past Psychiatric History: Please see H&P.   Past Medical History:  Past Medical History:  Diagnosis Date  . Depression   . Hypertension     Past Surgical History:  Procedure Laterality Date  . EXTERNAL EAR SURGERY Right    "it was cutt off and sutured back on"   Family History:  Family History  Problem Relation Age of Onset  . Mental illness Neg Hx    Family Psychiatric  History: Please see H&P.  Social History:  History  Alcohol Use No     History  Drug use: Unknown    Social History   Social History  . Marital status: Married    Spouse name: N/A  . Number of children: N/A  . Years of education: N/A   Social History Main Topics  . Smoking status: Current Every Day Smoker  . Smokeless tobacco: Never Used  . Alcohol use No  . Drug use: Unknown  . Sexual activity: Not Asked   Other Topics Concern  . None   Social History Narrative  . None   Additional Social History:                         Sleep: Poor  Appetite:   Fair  Current Medications: Current Facility-Administered Medications  Medication Dose Route Frequency Provider Last Rate Last Dose  . acetaminophen (TYLENOL) tablet 650 mg  650 mg Oral Q6H PRN Okonkwo, [name], NP      . alum & mag hydroxide-simeth (MAALOX/MYLANTA) 200-200-20 MG/5ML suspension 30 mL  30 mL Oral Q4H PRN Okonkwo, [name], NP   30 mL at 11/02/16 2117  . amLODipine (NORVASC) tablet 5 mg  5 mg Oral Daily [name], PA-C   5 mg at 11/05/16 1619  . buPROPion (WELLBUTRIN) tablet 100 mg  100 mg Oral BID Thresa Ross, MD   100 mg at 11/06/16 1658  . cloNIDine (CATAPRES) tablet 0.1 mg  0.1 mg Oral BID [name], PA-C   0.1 mg at 11/06/16 1658  . hydrOXYzine (ATARAX/VISTARIL) tablet 25 mg  25 mg Oral TID PRN [name], [name], NP   25 mg at 11/06/16 2217  . magnesium hydroxide (MILK OF MAGNESIA) suspension 30 mL  30 mL Oral Daily PRN Okonkwo, [name], NP      . traZODone (DESYREL) tablet 50 mg  50 mg Oral QHS,MR X 1 Nira Conn A, NP   50 mg at 11/06/16 2306    Lab Results: No results found for this or  any previous visit (from the past 48 hour(s)).  Blood Alcohol level:  Lab Results  Component Value Date   ETH <5 10/31/2016    Metabolic Disorder Labs: No results found for: HGBA1C, MPG No results found for: PROLACTIN No results found for: CHOL, TRIG, HDL, CHOLHDL, VLDL, LDLCALC  Physical Findings: AIMS: Facial and Oral Movements Muscles of Facial Expression: None, normal Lips and Perioral Area: None, normal Jaw: None, normal Tongue: None, normal,Extremity Movements Upper (arms, wrists, hands, fingers): None, normal Lower (legs, knees, ankles, toes): None, normal, Trunk Movements Neck, shoulders, hips: None, normal, Overall Severity Severity of abnormal movements (highest score from questions above): None, normal Incapacitation due to abnormal movements: None, normal Patient's awareness of abnormal movements (rate only patient's report): No Awareness,  Dental Status Current problems with teeth and/or dentures?: No Does patient usually wear dentures?: No  CIWA:    COWS:     Musculoskeletal: Strength & Muscle Tone: within normal limits Gait & Station: seen in bed Patient leans: N/A  Psychiatric Specialty Exam: Physical Exam  Nursing note and vitals reviewed.   Review of Systems  Psychiatric/Behavioral: Positive for depression and substance abuse. The patient has insomnia.   All other systems reviewed and are negative.   Blood pressure (!) 152/82, pulse 62, temperature 98.4 F (36.9 C), resp. rate 16, height 5\' 10"  (1.778 m), weight 110.1 kg (242 lb 11.6 oz).Body mass index is 34.83 kg/m.  General Appearance: Guarded  Eye Contact:  None  Speech:  Slow  Volume:  Decreased  Mood:  Dysphoric  Affect:  Depressed  Thought Process:  Goal Directed and Descriptions of Associations: Circumstantial  Orientation:  Full (Time, Place, and Person)  Thought Content:  Logical  Suicidal Thoughts:  No  Homicidal Thoughts:  No  Memory:  Immediate;   Fair Recent;   Fair Remote;   Fair  Judgement:  Impaired  Insight:  Shallow  Psychomotor Activity:  Normal  Concentration:  Concentration: Fair and Attention Span: Fair  Recall:  [employer]Fair  Fund of Knowledge:  Fair  Language:  Fair  Akathisia:  No  Handed:  Right  AIMS (if indicated):     Assets:  Desire for Improvement  ADL's:  Intact  Cognition:  WNL  Sleep:  Number of Hours: 4.5   Major depressive disorder, single episode, severe without psychotic features (HCC) unstable- progressing   Will continue today 11/07/16  plan as below except where it is noted.    Treatment Plan Summary:Patient seen in bed as withdrawn , reports sleep issues , continue to make medication changes. Daily contact with patient to assess and evaluate symptoms and progress in treatment, Medication management and Plan see below   Reviewed past medical records,treatment plan.  Will continue to monitor vitals  ,medication compliance and treatment side effects while patient is here.  Will monitor for medical issues as well as call consult as needed.  Change Bupropion to XL - 150 mg po daily for affective sx.  Increase Trazodone to 150 mg po qhs for insomnia. CSW will continue working on disposition.  Patient to participate in therapeutic milieu .       Don Lemelin, MD 11/07/2016, 3:24 PM

## 2016-11-07 NOTE — Tx Team (Signed)
Interdisciplinary Treatment and Diagnostic Plan Update  11/07/2016 Time of Session: 0930AM Don ShockMichael Vaughn MRN: [id number]  Principal Diagnosis: Major depressive disorder, single episode, severe without psychotic features (HCC)  Secondary Diagnoses: Principal Problem:   Major depressive disorder, single episode, severe without psychotic features (HCC) Active Problems:   Stimulant use disorder   Current Medications:  Current Facility-Administered Medications  Medication Dose Route Frequency Provider Last Rate Last Dose  . acetaminophen (TYLENOL) tablet 650 mg  650 mg Oral Q6H PRN Okonkwo, [name], NP      . alum & mag hydroxide-simeth (MAALOX/MYLANTA) 200-200-20 MG/5ML suspension 30 mL  30 mL Oral Q4H PRN Okonkwo, [name], NP   30 mL at 11/02/16 2117  . amLODipine (NORVASC) tablet 5 mg  5 mg Oral Daily Kerry HoughSimon, Spencer E, PA-C   5 mg at 11/05/16 1619  . buPROPion (WELLBUTRIN) tablet 100 mg  100 mg Oral BID Thresa RossAkhtar, Nadeem, MD   100 mg at 11/06/16 1658  . cloNIDine (CATAPRES) tablet 0.1 mg  0.1 mg Oral BID Kerry HoughSimon, Spencer E, PA-C   0.1 mg at 11/06/16 1658  . hydrOXYzine (ATARAX/VISTARIL) tablet 25 mg  25 mg Oral TID PRN Beryle Lathekonkwo, [name], NP   25 mg at 11/06/16 2217  . magnesium hydroxide (MILK OF MAGNESIA) suspension 30 mL  30 mL Oral Daily PRN Okonkwo, [name], NP      . traZODone (DESYREL) tablet 50 mg  50 mg Oral QHS,MR X 1 Nira ConnBerry, Jason A, NP   50 mg at 11/06/16 2306   PTA Medications: Prescriptions Prior to Admission  Medication Sig Dispense Refill Last Dose  . HYDROcodone-acetaminophen (NORCO/VICODIN) 5-325 MG per tablet Take 2 tablets by mouth every 6 (six) hours as needed for moderate pain or severe pain. (Patient not taking: Reported on 11/01/2016) 13 tablet 0 Not Taking at Unknown time    Patient Stressors: Financial difficulties Occupational concerns  Patient Strengths: Ability for insight Average or above average intelligence Communication skills Motivation for  treatment/growth Physical Health  Treatment Modalities: Medication Management, Group therapy, Case management,  1 to 1 session with clinician, Psychoeducation, Recreational therapy.   Physician Treatment Plan for Primary Diagnosis: Major depressive disorder, single episode, severe without psychotic features (HCC) Long Term Goal(s): Improvement in symptoms so as ready for discharge Improvement in symptoms so as ready for discharge   Short Term Goals: Ability to verbalize feelings will improve Compliance with prescribed medications will improve Ability to verbalize feelings will improve Compliance with prescribed medications will improve  Medication Management: Evaluate patient's response, side effects, and tolerance of medication regimen.  Therapeutic Interventions: 1 to 1 sessions, Unit Group sessions and Medication administration.  Evaluation of Outcomes: Adequate for discharge   Physician Treatment Plan for Secondary Diagnosis: Principal Problem:   Major depressive disorder, single episode, severe without psychotic features (HCC) Active Problems:   Stimulant use disorder  Long Term Goal(s): Improvement in symptoms so as ready for discharge Improvement in symptoms so as ready for discharge   Short Term Goals: Ability to verbalize feelings will improve Compliance with prescribed medications will improve Ability to verbalize feelings will improve Compliance with prescribed medications will improve     Medication Management: Evaluate patient's response, side effects, and tolerance of medication regimen.  Therapeutic Interventions: 1 to 1 sessions, Unit Group sessions and Medication administration.  Evaluation of Outcomes: Adequate for discharge    RN Treatment Plan for Primary Diagnosis: Major depressive disorder, single episode, severe without psychotic features (HCC) Long Term Goal(s): Knowledge of  disease and therapeutic regimen to maintain health will improve  Short  Term Goals: Ability to remain free from injury will improve, Ability to verbalize feelings will improve and Ability to disclose and discuss suicidal ideas  Medication Management: RN will administer medications as ordered by provider, will assess and evaluate patient's response and provide education to patient for prescribed medication. RN will report any adverse and/or side effects to prescribing provider.  Therapeutic Interventions: 1 on 1 counseling sessions, Psychoeducation, Medication administration, Evaluate responses to treatment, Monitor vital signs and CBGs as ordered, Perform/monitor CIWA, COWS, AIMS and Fall Risk screenings as ordered, Perform wound care treatments as ordered.  Evaluation of Outcomes: Adequate for discharge    LCSW Treatment Plan for Primary Diagnosis: Major depressive disorder, single episode, severe without psychotic features (HCC) Long Term Goal(s): Safe transition to appropriate next level of care at discharge, Engage patient in therapeutic group addressing interpersonal concerns.  Short Term Goals: Engage patient in aftercare planning with referrals and resources, Facilitate patient progression through stages of change regarding substance use diagnoses and concerns and Identify triggers associated with mental health/substance abuse issues  Therapeutic Interventions: Assess for all discharge needs, 1 to 1 time with Social worker, Explore available resources and support systems, Assess for adequacy in community support network, Educate family and significant other(s) on suicide prevention, Complete Psychosocial Assessment, Interpersonal group therapy.  Evaluation of Outcomes: Adequate for discharge    Progress in Treatment: Attending groups: No Participating in groups: No. Taking medication as prescribed: Yes. Toleration medication: Yes. Family/Significant other contact made: SPE completed with pt; pt declined to consent to family contact.  Patient understands  diagnosis: Yes. Discussing patient identified problems/goals with staff: No. Medical problems stabilized or resolved: Yes. Denies suicidal/homicidal ideation: Yes Issues/concerns per patient self-inventory: No. Other: n/a   New problem(s) identified: No, Describe:  n/a  New Short Term/Long Term Goal(s): detox, medication stabilization, elimination of SI thoughts, development of comprehensive mental wellness/sobriety plan.   Patient Goal: to get put on medication and start feeling better.  Discharge Plan or Barriers: Pt given [place] [facility] and [facility]. Pt given boarding room list and extended motel list. Pt will follow-up at [facility]. He was provided with [facility] pamphlet and NA/AA lists for [facility]. Pt not interested in inpatient treatment and did not actively participate in group therapy during his stay.   Reason for Continuation of Hospitalization: none  Estimated Length of Stay:  Monday, 11/07/16  Attendees: Patient: 11/07/2016 10:30 AM  Physician: Dr. Elna Breslow MD 11/07/2016 10:30 AM  Nursing: Wilhelmenia Blase RN 11/07/2016 10:30 AM  RN Care Manager: Onnie Boer CM 11/07/2016 10:30 AM  Social Worker: Trula Slade, LCSW 11/07/2016 10:30 AM  Recreational Therapist: x 11/07/2016 10:30 AM  Other: Armandina Stammer NP; Ellin Saba NP 11/07/2016 10:30 AM  Other:  11/07/2016 10:30 AM  Other: 11/07/2016 10:30 AM    Scribe for Treatment Team: Ledell Peoples Smart, LCSW 11/07/2016 10:30 AM

## 2016-11-07 NOTE — Progress Notes (Addendum)
  [facility] Discharge [facility] :  Will you be returning to the same living situation after discharge:  No.Pt going to halfway house at discharge At discharge, do you have transportation home?: Yes,  bus. PATIENT DISCHARGE RESCHEDULED FOR Tuesday, 11/08/16 Do you have the ability to pay for your medications: Yes,  mental health  Release of information consent forms completed and submitted to medical records by CSW.  Patient to Follow up at: Follow-up Information    [facility].   Specialty:  Behavioral Health Why:  Please go within 1-3 days of discharge to be established with outpatient resources. Walk-in hours are from 8am-3pm Monday-Friday Contact information: [address] [place] [address] [phone]           Next level of care provider has access to [facility] Link:no  Safety Planning and Suicide Prevention discussed: Yes,  SPE completed with pt; pt declined to consent to family contact. [facility] pamphlet and Mobile Crisis information provdied.  Have you used any form of tobacco in the last 30 days? (Cigarettes, Smokeless Tobacco, Cigars, and/or Pipes): No  Has patient been referred to the Quitline?: N/A patient is not a smoker  Patient has been referred for addiction treatment: Yes  Shadow Stiggers N Smart LCSW 11/07/2016, 10:29 AM

## 2016-11-07 NOTE — Progress Notes (Signed)
Patient ID: Don ShockMichael Vaughn, male   DOB: 02-Jun-1985, 31 y.o.   MRN: [id number]  D: Patient observed watching TV and interacting well with peers on approach. Pt reports he moved here to be close to family but because of drugs he does not want them close. Pt reports he wants to get well and have his own family one day. Pt reports he is not an early riser and is having trouble waking up to get vitals and interact with staff.  Denies  SI/HI/AVH and pain.No behavioral issues noted.  A: Support and encouragement offered as needed. Medications administered as prescribed.  R: Patient is safe and cooperative on unit. Will continue to monitor patient for safety and stability.

## 2016-11-07 NOTE — Progress Notes (Signed)
Patient ID: Don Vaughn, male   DOB: [birth year], 31 y.o.   MRN: [id number] D: Patient remains in bed this morning.  He refuses to come out and get his medications.  Per night shift, he did interact with staff last night and was not as irritable.  He refuses to fill out his self inventory.  He did go to breakfast, however, returned from cafeteria and went to bed.  Patient denies any thoughts of self harm.  He is homeless and not sure where he will be discharge to.  Patient would not respond to MD this morning and his discharge was cancelled for today. A: Continue to monitor medication management and MD orders.  Safety checks completed every 15 minutes per protocol.  Offer support and encouragement as needed. R: Patient remains isolative and withdrawn.

## 2016-11-07 NOTE — Progress Notes (Signed)
Patient ID: Don ShockMichael Vaughn, male   DOB: [birth year], 31 y.o.   MRN: [id number] Patient was asked if he wanted to take his clonidine.  He states, "Yeah I want it."  Informed patient that I would need his blood pressure.  Patient refused stating, "Nah, I don't want to do that.  I just want you to give it to me."  Informed patient that without his blood pressure reading, I would not administer his clonidine.  Patient is sitting in chair in the day room; he has not been to any groups today.

## 2016-11-07 NOTE — Progress Notes (Signed)
At the beginning of the shift, pt was in the dayroom talking with peers and playing cards.  Pt was guarded and minimal with Clinical research associatewriter.  He denies SI/HI/AVH.  He voiced no needs or concerns at that time.  He was encouraged to make his needs known to staff.  He remained cooperative and appropriate until he went to bed.  A few minutes ago, the hall MHT came to Clinical research associatewriter saying that pt was cursing at her about needing to leave the door open.  Writer went to pt's room to tell him that the door needed to remain at least 6 inches opened.  He began cursing at Clinical research associatewriter also.  Pt was reminded that 15 minute checks had to be done.  He was agreeable to the 6 inch opening, but still very irritable.  Support offered.  Discharge plans are in process.  Report received from previous shift that pt is discharging on Tuesday.  Safety maintained with q15 minute checks.

## 2016-11-07 NOTE — Plan of Care (Signed)
Problem: Safety: Goal: Periods of time without injury will increase Outcome: Progressing Pt is safe on the unit. denies SI/HI.

## 2016-11-08 MED ORDER — CLONIDINE HCL 0.1 MG PO TABS: 0.1000 mg | ORAL_TABLET | Freq: Two times a day (BID) | ORAL | 0 refills | Status: DC

## 2016-11-08 MED ORDER — AMLODIPINE BESYLATE 5 MG PO TABS: 5.0000 mg | ORAL_TABLET | Freq: Every day | ORAL | 0 refills | Status: DC

## 2016-11-08 MED ORDER — BUPROPION HCL ER (XL) 150 MG PO TB24: 150.0000 mg | ORAL_TABLET | Freq: Every day | ORAL | 0 refills | Status: DC

## 2016-11-08 MED ORDER — HYDROXYZINE HCL 25 MG PO TABS: 25.0000 mg | ORAL_TABLET | Freq: Three times a day (TID) | ORAL | 0 refills | Status: DC | PRN

## 2016-11-08 MED ORDER — TRAZODONE HCL 150 MG PO TABS: 150.0000 mg | ORAL_TABLET | Freq: Every day | ORAL | 0 refills | Status: DC

## 2016-11-08 NOTE — Progress Notes (Signed)
Staff remind pt the door has to remain open. Pt began to use ugly language. Pt closed the door. Staff open the door again because the door has to remain open.

## 2016-11-08 NOTE — Progress Notes (Signed)
Pt was very rude he was using ugly language do not close his door. Staff advised pt all the doors has to be open at all times. Staff asked RN to speak to pt. RN remind pt the door has to remain open.

## 2016-11-08 NOTE — Progress Notes (Signed)
Patient ID: Don Vaughn, male   DOB: 01/23/1986, 31 y.o.   MRN: [id number] NSG D/C Note:Pt denies si/hi at this time. States that he will comply with outpt services and take his meds as prescribed.D/C to lobby with [employer].

## 2016-11-08 NOTE — Progress Notes (Signed)
Pt attend wrap up group. His day was a 2. Pt said his goal was to leave today. He did not leave. This makes him irriated today.

## 2016-11-08 NOTE — Discharge Summary (Signed)
Physician Discharge Summary Note  Patient:  Don Vaughn is an 31 y.o., male MRN:  [id number] DOB:  02-07-1986 Patient phone:  [phone] (home)  Patient address:   [address] [place] [id number],  Total Time spent with patient: Greater than 30 minutes  Date of Admission:  11/01/2016 Date of Discharge: 11-08-16  Reason for Admission: Suicidal ideations with plans to cut his wrist.  Principal Problem: Major depressive disorder, single episode, severe without psychotic features [facility](HCC)  Discharge Diagnoses: Patient Active Problem List   Diagnosis Date Noted  . Major depressive disorder, single episode, severe without psychotic features (HCC) [F32.2] 11/02/2016  . Stimulant use disorder [F15.90] 11/01/2016   Past Psychiatric History: Stimulant use disorder, MDD  Past Medical History:  Past Medical History:  Diagnosis Date  . Depression   . Hypertension     Past Surgical History:  Procedure Laterality Date  . EXTERNAL EAR SURGERY Right    "it was cutt off and sutured back on"   Family History:  Family History  Problem Relation Age of Onset  . Mental illness Neg Hx    Family Psychiatric  History: See H&P.  Social History:  History  Alcohol Use No     History  Drug use: Unknown    Social History   Social History  . Marital status: Married    Spouse name: N/A  . Number of children: N/A  . Years of education: N/A   Social History Main Topics  . Smoking status: Current Every Day Smoker  . Smokeless tobacco: Never Used  . Alcohol use No  . Drug use: Unknown  . Sexual activity: Not Asked   Other Topics Concern  . None   Social History Narrative  . None   Hospital Course: (Per admission notes): Don Vaughn is a [age] y old AAM,homeless, unemployed, who has a hx of depression as well as stimulant abuse, presented to the ED with worsening SI with plan to cut his wrist. Pt today seen as sad, withdrawn, has psychomotor retardation, reports worsening  depressive sx. Reports he has trouble concentrating as well as has been having worsening SI with plan to cut. Pt reports stressors of job loss , lack of social support , as well as homelessness.  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 Pt reports that he stopped using it because of the way it made him feel. He felt euphoric and hyper when he took it.  After admission the above assessment, Don Vaughn was started on medications for mood stabilization. He received & was discharged on Wellbutrin XL 150 mg for depression, Hydroxyzine 25 mg prn for anxiety & Trazod0ne 150 mg for insomnia. He also received other medication regimen for the other medical issues presented. He tolerated his treatment regimen without any adverse effects reported. He was enrolled in the group counseling sessions being offered & held on this unit, he learned coping skills.  Don Vaughn is seen today by his attending psychiatrist. He stated that he is pleased that he sought help. Says he is tolerating his medications well. No withdrawal symptoms. No craving for substances. He is no longer feeling depressed. Describes normal energy and ability to think. Able to focus on task. No suicidal thoughts. No homicidal thoughts. No thoughts of violence. Patient reports normal biological functions.   The nursing staff reports that patient has been appropriate on the unit. Patient has been interacting well with peers & staff. No behavioral issues. Patient has  not voiced any suicidal thoughts. Patient has not been observed to be internally stimulated or occupied. Patient has been adherent with treatment recommendations. Patient has been tolerating his medication well, denies any adverse reactions or side effects.   Patient was discussed at the team meeting this morning. Team members feels that patient is back to his baseline level of function. Team agrees with plan to discharge patient today to continue mental  health care on outpatient basis as noted below. Upon discharge, Don Vaughn adamantly denies any SIHI, AVH, delusional thoughts, paranoia or substance withdrawal symptoms. He will continue further psychiatric follow-up care/medication management on an outpatient basis as noted below. He was provided with all the necessary information needed to make this appointment without problems. He left [facility] with all personal belongings in no apparent distress. [facility] pharmacy provided patient with a 7 days worth supply samples of discharge medication. Transportation per city bus. [facility] assisted with bus pass..  Physical Findings: AIMS: Facial and Oral Movements Muscles of Facial Expression: None, normal Lips and Perioral Area: None, normal Jaw: None, normal Tongue: None, normal,Extremity Movements Upper (arms, wrists, hands, fingers): None, normal Lower (legs, knees, ankles, toes): None, normal, Trunk Movements Neck, shoulders, hips: None, normal, Overall Severity Severity of abnormal movements (highest score from questions above): None, normal Incapacitation due to abnormal movements: None, normal Patient's awareness of abnormal movements (rate only patient's report): No Awareness, Dental Status Current problems with teeth and/or dentures?: No Does patient usually wear dentures?: No  CIWA:    COWS:     Musculoskeletal: Strength & Muscle Tone: within normal limits Gait & Station: normal Patient leans: N/A  Psychiatric Specialty Exam: Physical Exam  Constitutional: He appears well-developed.  HENT:  Head: Normocephalic.  Eyes: Pupils are equal, round, and reactive to light.  Neck: Normal range of motion.  Cardiovascular:  Hx. HTN  Respiratory: Effort normal.  GI: Soft.  Genitourinary:  Genitourinary Comments: Deferred  Musculoskeletal: Normal range of motion.  Neurological: He is alert.  Skin: Skin is warm.    Review of Systems  Constitutional: Negative.   HENT: Negative.   Eyes: Negative.    Respiratory: Negative.   Cardiovascular: Negative.   Gastrointestinal: Negative.   Genitourinary: Negative.   Musculoskeletal: Negative.   Skin: Negative.   Neurological: Negative.   Endo/Heme/Allergies: Negative.   Psychiatric/Behavioral: Positive for depression (Stable) and substance abuse (Hx. Stimulant use disorder). Negative for hallucinations, memory loss and suicidal ideas. The patient has insomnia (Stable). The patient is not nervous/anxious.     Blood pressure (!) 152/82, pulse 62, temperature 98.4 F (36.9 C), resp. rate 16, height 5\' 10"  (1.778 m), weight 110.1 kg (242 lb 11.6 oz).Body mass index is 34.83 kg/m.  See Md's SRA   Have you used any form of tobacco in the last 30 days? (Cigarettes, Smokeless Tobacco, Cigars, and/or Pipes): No  Has this patient used any form of tobacco in the last 30 days? (Cigarettes, Smokeless Tobacco, Cigars, and/or Pipes): No  Blood Alcohol level:  Lab Results  Component Value Date   ETH <5 10/31/2016   Metabolic Disorder Labs:  No results found for: HGBA1C, MPG No results found for: PROLACTIN No results found for: CHOL, TRIG, HDL, CHOLHDL, VLDL, LDLCALC  See Psychiatric Specialty Exam and Suicide Risk Assessment completed by Attending Physician prior to discharge.  Discharge destination:  Home  Is patient on multiple antipsychotic therapies at discharge:  No   Has Patient had three or more failed trials of antipsychotic monotherapy by history:  No  Recommended Plan for Multiple Antipsychotic Therapies: NA  Allergies as of 11/08/2016   No Known Allergies     Medication List    STOP taking these medications   HYDROcodone-acetaminophen 5-325 MG tablet Commonly known as:  NORCO/VICODIN     TAKE these medications     Indication  amLODipine 5 MG tablet Commonly known as:  NORVASC Take 1 tablet (5 mg total) by mouth daily. For elevated blood pressure  Indication:  High Blood Pressure Disorder   buPROPion 150 MG 24 hr  tablet Commonly known as:  WELLBUTRIN XL Take 1 tablet (150 mg total) by mouth daily. For depression Start taking on:  11/09/2016  Indication:  Major Depressive Disorder   cloNIDine 0.1 MG tablet Commonly known as:  CATAPRES Take 1 tablet (0.1 mg total) by mouth 2 (two) times daily. For high blood pressure  Indication:  High Blood Pressure Disorder   hydrOXYzine 25 MG tablet Commonly known as:  ATARAX/VISTARIL Take 1 tablet (25 mg total) by mouth 3 (three) times daily as needed for anxiety.  Indication:  Anxiety Neurosis   traZODone 150 MG tablet Commonly known as:  DESYREL Take 1 tablet (150 mg total) by mouth at bedtime. For sleep  Indication:  Trouble Sleeping      Follow-up Information    [facility] Follow up.   Specialty:  Behavioral Health Why:  Please go within 1-3 days of discharge to be established with outpatient resources. Walk-in hours are from 8am-3pm Monday-Friday Contact information: [address] [place] [address] [phone]          Follow-up recommendations:  Activity:  As tolerated Diet: As recommended by your primary care doctor. Keep all scheduled follow-up appointments as recommended.  Comments: Patient is instructed prior to discharge to: Take all medications as prescribed by his/her mental healthcare provider. Report any adverse effects and or reactions from the medicines to his/her outpatient provider promptly. Patient has been instructed & cautioned: To not engage in alcohol and or illegal drug use while on prescription medicines. In the event of worsening symptoms, patient is instructed to call the crisis hotline, 911 and or go to the nearest ED for appropriate evaluation and treatment of symptoms. To follow-up with his/her primary care provider for your other medical issues, concerns and or health care needs.   Signed: Sanjuana Kava, NP, PMHNP, FNP-BC. 11/08/2016, 10:44 AM

## 2016-11-08 NOTE — BHH Suicide Risk Assessment (Signed)
[facility]   Principal Problem: Major depressive disorder, single episode, severe without psychotic features [facility](HCC) Discharge Diagnoses:  Patient Active Problem List   Diagnosis Date Noted  . Major depressive disorder, single episode, severe without psychotic features (HCC) [F32.2] 11/02/2016  . 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 The patient is not nervous/anxious.   All other systems reviewed and are negative.   Blood pressure (!) 152/82, pulse 62, temperature 98.4 F (36.9 C), resp. rate 16, height 5\' 10"  (1.778 m), weight 110.1 kg (242 lb 11.6 oz).Body mass index is 34.83 kg/m.  General Appearance: Casual  Eye Contact::  Fair  Speech:  Clear and Coherent409  Volume:  Normal  Mood:  Euthymic  Affect:  Congruent  Thought Process:  Goal Directed and Descriptions of Associations: Intact  Orientation:  Full (Time, Place, and Person)  Thought Content:  Logical  Suicidal Thoughts:  No  Homicidal Thoughts:  No  Memory:  Immediate;   Fair Recent;   Fair Remote;   Fair  Judgement:  Fair  Insight:  Fair  Psychomotor Activity:  Normal  Concentration:  Fair  Recall:  FiservFair  Fund of Knowledge:Fair  Language: Fair  Akathisia:  No  Handed:  Right  AIMS (if indicated):     Assets:  Communication Skills Desire for Improvement  Sleep:  Number of Hours: 3.75  Cognition: WNL  ADL's:  Intact   Mental Status Per Nursing Assessment::   On Admission:  Suicidal ideation indicated by patient, Suicide plan, Plan includes specific time, place, or method, Self-harm thoughts  Demographic Factors:  Male  Loss Factors: NA  Historical Factors: Impulsivity  Risk Reduction Factors:    Positive social support  Continued Clinical Symptoms:  Previous Psychiatric Diagnoses and Treatments  Cognitive Features That Contribute To Risk:  None    Suicide Risk:  Minimal: No identifiable suicidal ideation.  Patients presenting with no risk factors but with morbid ruminations; may be classified as minimal risk based on the severity of the depressive symptoms  Follow-up Information    [facility] Follow up.   Specialty:  Behavioral Health Why:  Please go within 1-3 days of discharge to be established with outpatient resources. Walk-in hours are from 8am-3pm Monday-Friday Contact information: [address] [place] [address] [phone]           Plan Of Care/Follow-up recommendations:  Activity:  no restrictions Diet:  regular Tests:  as needed Other:  follow up with aftercare  Ronnie Doo, MD 11/08/2016, 2:20 PM

## 2016-11-08 NOTE — BHH Group Notes (Signed)
BHH Group Notes:  (Nursing/MHT/Case Management/Adjunct)  Date:  11/08/2016  Time:  0902  Type of Therapy:  Nurse Education  Participation Level:  Did Not Attend  Summary of Progress/Problems: Pt was invited but did not attend group.  Maurine SimmeringShugart, Clifton Kovacic M 11/08/2016, 10:00 AM

## 2016-11-26 ENCOUNTER — Encounter (HOSPITAL_COMMUNITY): Payer: Self-pay | Admitting: *Deleted

## 2016-11-26 ENCOUNTER — Emergency Department (HOSPITAL_COMMUNITY)
Admission: EM | Admit: 2016-11-26 | Discharge: 2016-11-27 | Disposition: A | Discharge status: Home / Self Care | Payer: No Typology Code available for payment source | Attending: Emergency Medicine | Admitting: Emergency Medicine

## 2016-11-26 DIAGNOSIS — F17200 Nicotine dependence, unspecified, uncomplicated: Secondary | ICD-10-CM | POA: Insufficient documentation

## 2016-11-26 DIAGNOSIS — Z79899 Other long term (current) drug therapy: Secondary | ICD-10-CM | POA: Insufficient documentation

## 2016-11-26 DIAGNOSIS — I10 Essential (primary) hypertension: Secondary | ICD-10-CM | POA: Insufficient documentation

## 2016-11-26 DIAGNOSIS — F332 Major depressive disorder, recurrent severe without psychotic features: Secondary | ICD-10-CM | POA: Insufficient documentation

## 2016-11-26 DIAGNOSIS — F1910 Other psychoactive substance abuse, uncomplicated: Secondary | ICD-10-CM | POA: Diagnosis present

## 2016-11-26 DIAGNOSIS — F1994 Other psychoactive substance use, unspecified with psychoactive substance-induced mood disorder: Secondary | ICD-10-CM

## 2016-11-26 LAB — CBC
HCT: 37.4 % — ABNORMAL LOW (ref 39.0–52.0)
Hemoglobin: 12.9 g/dL — ABNORMAL LOW (ref 13.0–17.0)
MCH: 28.7 pg (ref 26.0–34.0)
MCHC: 34.5 g/dL (ref 30.0–36.0)
MCV: 83.3 fL (ref 78.0–100.0)
Platelets: 195 10*3/uL (ref 150–400)
RBC: 4.49 MIL/uL (ref 4.22–5.81)
RDW: 14.3 % (ref 11.5–15.5)
WBC: 4.9 10*3/uL (ref 4.0–10.5)

## 2016-11-26 LAB — RAPID URINE DRUG SCREEN, HOSP PERFORMED
Amphetamines: NOT DETECTED
Barbiturates: NOT DETECTED
Benzodiazepines: NOT DETECTED
Cocaine: POSITIVE — AB
Opiates: NOT DETECTED
Tetrahydrocannabinol: POSITIVE — AB

## 2016-11-26 LAB — COMPREHENSIVE METABOLIC PANEL
ALT: 42 U/L (ref 17–63)
AST: 33 U/L (ref 15–41)
Albumin: 3.8 g/dL (ref 3.5–5.0)
Alkaline Phosphatase: 55 U/L (ref 38–126)
Anion gap: 7 (ref 5–15)
BUN: 22 mg/dL — ABNORMAL HIGH (ref 6–20)
CO2: 25 mmol/L (ref 22–32)
Calcium: 8.7 mg/dL — ABNORMAL LOW (ref 8.9–10.3)
Chloride: 109 mmol/L (ref 101–111)
Creatinine, Ser: 1.35 mg/dL — ABNORMAL HIGH (ref 0.61–1.24)
GFR calc Af Amer: >60 mL/min (ref >60)
GFR calc non Af Amer: >60 mL/min (ref >60)
Glucose, Bld: 101 mg/dL — ABNORMAL HIGH (ref 65–99)
Potassium: 3.7 mmol/L (ref 3.5–5.1)
Sodium: 141 mmol/L (ref 135–145)
Total Bilirubin: 0.5 mg/dL (ref 0.3–1.2)
Total Protein: 7.1 g/dL (ref 6.5–8.1)

## 2016-11-26 LAB — ACETAMINOPHEN LEVEL: Acetaminophen (Tylenol), Serum: <10 ug/mL — ABNORMAL LOW (ref 10–30)

## 2016-11-26 LAB — ETHANOL: Alcohol, Ethyl (B): <5 mg/dL (ref <5)

## 2016-11-26 LAB — SALICYLATE LEVEL: Salicylate Lvl: <7 mg/dL (ref 2.8–30.0)

## 2016-11-26 MED ORDER — TRAZODONE HCL 50 MG PO TABS
150.0000 mg | ORAL_TABLET | Freq: Every day | ORAL | Status: DC
  Filled 2016-11-26: qty 1

## 2016-11-26 MED ORDER — AMLODIPINE BESYLATE 5 MG PO TABS
5.0000 mg | ORAL_TABLET | Freq: Every day | ORAL | Status: DC
  Administered 2016-11-27: 5 mg via ORAL
  Filled 2016-11-26: qty 1

## 2016-11-26 MED ORDER — HYDROXYZINE HCL 25 MG PO TABS
25.0000 mg | ORAL_TABLET | Freq: Three times a day (TID) | ORAL | Status: DC | PRN
  Filled 2016-11-26: qty 1

## 2016-11-26 MED ORDER — BUPROPION HCL ER (XL) 150 MG PO TB24
150.0000 mg | ORAL_TABLET | Freq: Every day | ORAL | Status: DC
  Administered 2016-11-27: 150 mg via ORAL
  Filled 2016-11-26: qty 1

## 2016-11-26 MED ORDER — CLONIDINE HCL 0.1 MG PO TABS
0.1000 mg | ORAL_TABLET | Freq: Two times a day (BID) | ORAL | Status: DC
  Administered 2016-11-27 (×2): 0.1 mg via ORAL
  Filled 2016-11-26 (×3): qty 1

## 2016-11-26 NOTE — ED Triage Notes (Signed)
Patient is alert and oriented x4.  His being seen for suicidal ideations.  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  When he was DC from the facility they did give him prescriptions for medication but took all his other medications and did not return them.  Patient has been off Wellbutrin for about 2 weeks and started having suicidal thoughts.  Currently he is not showing any signs of distress.

## 2016-11-26 NOTE — ED Provider Notes (Signed)
[facility]-EMERGENCY DEPT Provider Note   CSN: [id number] Arrival date & time: 11/26/16  2207    History   Chief Complaint Chief Complaint  Patient presents with  . Suicidal    HPI Don Vaughn is a 31 y.o. male.  31 year old male with a history of depression and hypertension presents to the emergency department for suicidal ideations. He states that he went to a drug rehabilitation facility for methamphetamine use. He was released 2 weeks ago, but the facility did not provide him with his home medications. He has been off his Wellbutrin for the last 2 weeks with worsening suicidal ideations. No plan. Patient does report using marijuana and cocaine today. No alcohol use. He denies homicidal ideations.      Past Medical History:  Diagnosis Date  . Depression   . Hypertension     Patient Active Problem List   Diagnosis Date Noted  . Major depressive disorder, single episode, severe without psychotic features (HCC) 11/02/2016  . Stimulant use disorder 11/01/2016    Past Surgical History:  Procedure Laterality Date  . EXTERNAL EAR SURGERY Right    "it was cutt off and sutured back on"      Home Medications    Prior to Admission medications   Medication Sig Start Date End Date Taking? Authorizing Provider  amLODipine (NORVASC) 5 MG tablet Take 1 tablet (5 mg total) by mouth daily. For elevated blood pressure 11/08/16  Yes Nwoko, Agnes I, NP  buPROPion (WELLBUTRIN XL) 150 MG 24 hr tablet Take 1 tablet (150 mg total) by mouth daily. For depression 11/09/16  Yes Nwoko, Nicole KindredAgnes I, NP  cloNIDine (CATAPRES) 0.1 MG tablet Take 1 tablet (0.1 mg total) by mouth 2 (two) times daily. For high blood pressure 11/08/16  Yes Nwoko, Agnes I, NP  hydrOXYzine (ATARAX/VISTARIL) 25 MG tablet Take 1 tablet (25 mg total) by mouth 3 (three) times daily as needed for anxiety. 11/08/16  Yes Armandina StammerNwoko, Agnes I, NP  traZODone (DESYREL) 150 MG tablet Take 1 tablet (150 mg total) by mouth at bedtime. For sleep  11/08/16  Yes Sanjuana KavaNwoko, Agnes I, NP    Family History Family History  Problem Relation Age of Onset  . Mental illness Neg Hx     Social History Social History  Substance Use Topics  . Smoking status: Current Every Day Smoker  . Smokeless tobacco: Never Used  . Alcohol use No     Allergies   Patient has no known allergies.   Review of Systems Review of Systems Ten systems reviewed and are negative for acute change, except as noted in the HPI.    Physical Exam Updated Vital Signs BP (!) 195/111 (BP Location: Left Arm)   Pulse (!) 108   Temp 99.4 F (37.4 C) (Oral)   Resp 20   Ht 5\' 9"  (1.753 m)   Wt 104.3 kg (230 lb)   SpO2 100%   BMI 33.97 kg/m   Physical Exam  Constitutional: He is oriented to person, place, and time. He appears well-developed and well-nourished. No distress.  Nontoxic and in NAD  HENT:  Head: Normocephalic and atraumatic.  Eyes: Conjunctivae and EOM are normal. No scleral icterus.  Neck: Normal range of motion.  Pulmonary/Chest: Effort normal. No respiratory distress.  Respirations even and unlabored  Musculoskeletal: Normal range of motion.  Neurological: He is alert and oriented to person, place, and time. He exhibits normal muscle tone. Coordination normal.  Skin: Skin is warm and dry. No rash noted. He is  not diaphoretic. No erythema. No pallor.  Psychiatric: His behavior is normal. His mood appears anxious. He expresses suicidal ideation. He expresses no homicidal ideation. He expresses no suicidal plans and no homicidal plans.  Nursing note and vitals reviewed.    ED Treatments / Results  Labs (all labs ordered are listed, but only abnormal results are displayed) Labs Reviewed  COMPREHENSIVE METABOLIC PANEL - Abnormal; Notable for the following:       Result Value   Glucose, Bld 101 (*)    BUN 22 (*)    Creatinine, Ser 1.35 (*)    Calcium 8.7 (*)    All other components within normal limits  ACETAMINOPHEN LEVEL - Abnormal; Notable  for the following:    Acetaminophen (Tylenol), Serum <10 (*)    All other components within normal limits  CBC - Abnormal; Notable for the following:    Hemoglobin 12.9 (*)    HCT 37.4 (*)    All other components within normal limits  RAPID URINE DRUG SCREEN, HOSP PERFORMED - Abnormal; Notable for the following:    Cocaine POSITIVE (*)    Tetrahydrocannabinol POSITIVE (*)    All other components within normal limits  ETHANOL  SALICYLATE LEVEL    EKG  EKG Interpretation None       Radiology No results found.  Procedures Procedures (including critical care time)  Medications Ordered in ED Medications  amLODipine (NORVASC) tablet 5 mg (not administered)  buPROPion (WELLBUTRIN XL) 24 hr tablet 150 mg (not administered)  cloNIDine (CATAPRES) tablet 0.1 mg (0.1 mg Oral Given 11/27/16 0212)  hydrOXYzine (ATARAX/VISTARIL) tablet 25 mg (25 mg Oral Canceled Entry 11/27/16 0031)  traZODone (DESYREL) tablet 150 mg (150 mg Oral Canceled Entry 11/27/16 0031)     Initial Impression / Assessment and Plan / ED Course  I have reviewed the triage vital signs and the nursing notes.  Pertinent labs & imaging results that were available during my care of the patient were reviewed by me and considered in my medical decision making (see chart for details).     Patient presenting for SI; off Wellbutrin x 2 weeks. Patient without suicidal plan. He has been medically cleared and evaluated by TTS who recommend AM psychiatric evaluation. Disposition to be determined by oncoming ED provider.   Final Clinical Impressions(s) / ED Diagnoses   Final diagnoses:  Severe episode of recurrent major depressive disorder, without psychotic features [facility])    New Prescriptions New Prescriptions   No medications on file     Antony Madura, Cordelia Poche 11/27/16 1324    Mancel Bale, MD 11/27/16 [phone]

## 2016-11-26 NOTE — ED Notes (Signed)
TTS at beside.  

## 2016-11-27 ENCOUNTER — Encounter (HOSPITAL_COMMUNITY): Payer: Self-pay | Admitting: Registered Nurse

## 2016-11-27 DIAGNOSIS — F332 Major depressive disorder, recurrent severe without psychotic features: Secondary | ICD-10-CM | POA: Diagnosis not present

## 2016-11-27 DIAGNOSIS — F1910 Other psychoactive substance abuse, uncomplicated: Secondary | ICD-10-CM | POA: Diagnosis not present

## 2016-11-27 DIAGNOSIS — G4700 Insomnia, unspecified: Secondary | ICD-10-CM

## 2016-11-27 DIAGNOSIS — F1494 Cocaine use, unspecified with cocaine-induced mood disorder: Secondary | ICD-10-CM | POA: Diagnosis not present

## 2016-11-27 DIAGNOSIS — F17210 Nicotine dependence, cigarettes, uncomplicated: Secondary | ICD-10-CM | POA: Diagnosis not present

## 2016-11-27 DIAGNOSIS — F1290 Cannabis use, unspecified, uncomplicated: Secondary | ICD-10-CM

## 2016-11-27 DIAGNOSIS — F1994 Other psychoactive substance use, unspecified with psychoactive substance-induced mood disorder: Secondary | ICD-10-CM

## 2016-11-27 NOTE — Consult Note (Signed)
[place] [place] Psychiatry Consult   Reason for Consult:  Suicidal ideation Referring Physician:  EDP Patient Identification: Don Vaughn MRN:  [id number] Principal Diagnosis: Cocaine-induced mood disorder [facility]) Diagnosis:   Patient Active Problem List   Diagnosis Date Noted  . Polysubstance abuse [F19.10] 11/27/2016  . Cocaine-induced mood disorder ([place]) [F14.94] 11/27/2016  . Major depressive disorder, single episode, severe without psychotic features ([place]) [F32.2] 11/02/2016  . 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 male patient presents to [facility] with complaints of suicidal ideation.  HPI:  patient seen by Dr. Louretta Shorten and this provider.  Chart reviewed and face to face evaluation on 11/27/16.   On evaluation:  Don Vaughn reports that he relapsed yesterday on cocaine.  States that he wants to get into a long term rehab program.  States that after doing the cocaine last night he started to feel suicidal; Also states that he has not been taking his Wellbutrin.  At this time patient denies suicidal/homicidal ideation, psychosis, and paranoia.     Past Psychiatric History: Major Depression, Polysubstance abuse  Risk to Self: Suicidal Ideation: Yes-Currently Present Suicidal Intent: Yes-Currently Present Is patient at risk for suicide?: Yes Suicidal Plan?: Yes-Currently Present Specify Current Suicidal Plan: Patient reports having plans to shoot himself with a gun. Access to Means: Yes Specify Access to Suicidal Means: Patient reports having guns in his home. What has been your use of drugs/alcohol within the last 12 months?: Pt. reports Cocaine and Cannabis How many times?: 1 Other Self Harm Risks: Patient denies Triggers for Past Attempts: Family contact, Anniversary, Other (Comment), Unpredictable (Pt. report triggers from stress of college) Intentional Self Injurious  Behavior: None Risk to Others: Homicidal Ideation: No Thoughts of Harm to Others: No Current Homicidal Intent: No Current Homicidal Plan: No Access to Homicidal Means: No (Patient denies) Identified Victim: None History of harm to others?: No Assessment of Violence: On admission Violent Behavior Description: Patient denies Does patient have access to weapons?: Yes (Comment) (Patient reports having access to guns) Criminal Charges Pending?: No (Patient denies) Does patient have a court date: No (Patient denies) Prior Inpatient Therapy: Prior Inpatient Therapy: Yes Prior Therapy Dates: 10/2016, 11/2016 Prior Therapy Facilty/Provider(s): [facility] (Per Patient) Reason for Treatment: Depression and substance abuse Prior Outpatient Therapy: Prior Outpatient Therapy: Yes Prior Therapy Dates: Unknown Prior Therapy Facilty/Provider(s): Unknown Reason for Treatment: Depression Does patient have an ACCT team?: No Does patient have Intensive In-House Services?  : No Does patient have [facility] services? : No Does patient have P4[facility] services?: No  Past Medical History:  Past Medical History:  Diagnosis Date  . Depression   . Hypertension     Past Surgical History:  Procedure Laterality Date  . EXTERNAL EAR SURGERY Right    "it was cutt off and sutured back on"   Family History:  Family History  Problem Relation Age of Onset  . Mental illness Neg Hx    Family Psychiatric  History: Denies Social History:  History  Alcohol Use No     History  Drug Use  . Types: Marijuana    Social History   Social History  . Marital status: Married    Spouse name: N/A  . Number of children: N/A  . Years of education: N/A   Social History Main Topics  . Smoking status: Current Every Day Smoker  . Smokeless tobacco: Never Used  .  Alcohol use No  . Drug use: Yes    Types: Marijuana  . Sexual activity: Not Asked   Other Topics Concern  . None   Social History Narrative   . None   Additional Social History:    Allergies:  No Known Allergies  Labs:  Results for orders placed or performed during the hospital encounter of 11/26/16 (from the past 48 hour(s))  Comprehensive metabolic panel     Status: Abnormal   Collection Time: 11/26/16 10:51 PM  Result Value Ref Range   Sodium 141 135 - 145 mmol/L   Potassium 3.7 3.5 - 5.1 mmol/L   Chloride 109 101 - 111 mmol/L   CO2 25 22 - 32 mmol/L   Glucose, Bld 101 (H) 65 - 99 mg/dL   BUN 22 (H) 6 - 20 mg/dL   Creatinine, Ser 1.35 (H) 0.61 - 1.24 mg/dL   Calcium 8.7 (L) 8.9 - 10.3 mg/dL   Total Protein 7.1 6.5 - 8.1 g/dL   Albumin 3.8 3.5 - 5.0 g/dL   AST 33 15 - 41 U/L   ALT 42 17 - 63 U/L   Alkaline Phosphatase 55 38 - 126 U/L   Total Bilirubin 0.5 0.3 - 1.2 mg/dL   GFR calc non Af Amer >60 >60 mL/min   GFR calc Af Amer >60 >60 mL/min    Comment: (NOTE) The eGFR has been calculated using the CKD EPI equation. This calculation has not been validated in all clinical situations. eGFR's persistently <60 mL/min signify possible Chronic Kidney Disease.    Anion gap 7 5 - 15  Ethanol     Status: None   Collection Time: 11/26/16 10:51 PM  Result Value Ref Range   Alcohol, Ethyl (B) <5 <5 mg/dL    Comment:        LOWEST DETECTABLE LIMIT FOR SERUM ALCOHOL IS 5 mg/dL FOR MEDICAL PURPOSES ONLY   Salicylate level     Status: None   Collection Time: 11/26/16 10:51 PM  Result Value Ref Range   Salicylate Lvl <1.8 2.8 - 30.0 mg/dL  Acetaminophen level     Status: Abnormal   Collection Time: 11/26/16 10:51 PM  Result Value Ref Range   Acetaminophen (Tylenol), Serum <10 (L) 10 - 30 ug/mL    Comment:        THERAPEUTIC CONCENTRATIONS VARY SIGNIFICANTLY. A RANGE OF 10-30 ug/mL MAY BE AN EFFECTIVE CONCENTRATION FOR MANY PATIENTS. HOWEVER, SOME ARE BEST TREATED AT CONCENTRATIONS OUTSIDE THIS RANGE. ACETAMINOPHEN CONCENTRATIONS >150 ug/mL AT 4 HOURS AFTER INGESTION AND >50 ug/mL AT 12 HOURS AFTER  INGESTION ARE OFTEN ASSOCIATED WITH TOXIC REACTIONS.   cbc     Status: Abnormal   Collection Time: 11/26/16 10:51 PM  Result Value Ref Range   WBC 4.9 4.0 - 10.5 K/uL   RBC 4.49 4.22 - 5.81 MIL/uL   Hemoglobin 12.9 (L) 13.0 - 17.0 g/dL   HCT 37.4 (L) 39.0 - 52.0 %   MCV 83.3 78.0 - 100.0 fL   MCH 28.7 26.0 - 34.0 pg   MCHC 34.5 30.0 - 36.0 g/dL   RDW 14.3 11.5 - 15.5 %   Platelets 195 150 - 400 K/uL  Rapid urine drug screen (hospital performed)     Status: Abnormal   Collection Time: 11/26/16 10:51 PM  Result Value Ref Range   Opiates NONE DETECTED NONE DETECTED   Cocaine POSITIVE (A) NONE DETECTED   Benzodiazepines NONE DETECTED NONE DETECTED   Amphetamines NONE DETECTED NONE DETECTED  Tetrahydrocannabinol POSITIVE (A) NONE DETECTED   Barbiturates NONE DETECTED NONE DETECTED    Comment:        DRUG SCREEN FOR MEDICAL PURPOSES ONLY.  IF CONFIRMATION IS NEEDED FOR ANY PURPOSE, NOTIFY LAB WITHIN 5 DAYS.        LOWEST DETECTABLE LIMITS FOR URINE DRUG SCREEN Drug Class       Cutoff (ng/mL) Amphetamine      1000 Barbiturate      200 Benzodiazepine   703 Tricyclics       500 Opiates          300 Cocaine          300 THC              50     Current Facility-Administered Medications  Medication Dose Route Frequency Provider Last Rate Last Dose  . amLODipine (NORVASC) tablet 5 mg  5 mg Oral Daily Antonietta Breach, PA-C   5 mg at 11/27/16 0950  . buPROPion (WELLBUTRIN XL) 24 hr tablet 150 mg  150 mg Oral Daily Antonietta Breach, PA-C   150 mg at 11/27/16 0950  . cloNIDine (CATAPRES) tablet 0.1 mg  0.1 mg Oral BID Antonietta Breach, PA-C   0.1 mg at 11/27/16 0950  . hydrOXYzine (ATARAX/VISTARIL) tablet 25 mg  25 mg Oral TID PRN Antonietta Breach, PA-C      . traZODone (DESYREL) tablet 150 mg  150 mg Oral QHS Antonietta Breach, PA-C       Current Outpatient Prescriptions  Medication Sig Dispense Refill  . amLODipine (NORVASC) 5 MG tablet Take 1 tablet (5 mg total) by mouth daily. For elevated  blood pressure 30 tablet 0  . buPROPion (WELLBUTRIN XL) 150 MG 24 hr tablet Take 1 tablet (150 mg total) by mouth daily. For depression 30 tablet 0  . cloNIDine (CATAPRES) 0.1 MG tablet Take 1 tablet (0.1 mg total) by mouth 2 (two) times daily. For high blood pressure 30 tablet 0  . hydrOXYzine (ATARAX/VISTARIL) 25 MG tablet Take 1 tablet (25 mg total) by mouth 3 (three) times daily as needed for anxiety. 60 tablet 0  . traZODone (DESYREL) 150 MG tablet Take 1 tablet (150 mg total) by mouth at bedtime. For sleep 30 tablet 0    Musculoskeletal: Strength & Muscle Tone: within normal limits Gait & Station: normal Patient leans: N/A  Psychiatric Specialty Exam: Physical Exam  Constitutional: He is oriented to person, place, and time.  Neck: Normal range of motion.  Respiratory: Effort normal.  Musculoskeletal: Normal range of motion.  Neurological: He is alert and oriented to person, place, and time.  Psychiatric: He has a normal mood and affect. His speech is normal and behavior is normal. Cognition and memory are normal. He expresses impulsivity.    Review of Systems  Psychiatric/Behavioral: Positive for depression (Stable) and substance abuse (Cocaine, THC). Negative for hallucinations and suicidal ideas. The patient has insomnia (Stable). The patient is not nervous/anxious.     Blood pressure 138/86, pulse 69, temperature 97.6 F (36.4 C), temperature source Oral, resp. rate 20, height _0  (1.753 m), weight 104.3 kg (230 lb), SpO2 99 %.Body mass index is 33.97 kg/m.  General Appearance: Fairly Groomed  Eye Contact:  Good  Speech:  Clear and Coherent and Normal Rate  Volume:  Normal  Mood:  Appropriate  Affect:  Appropriate  Thought Process:  Goal Directed  Orientation:  Full (Time, Place, and Person)  Thought Content:  Logical  Suicidal Thoughts:  No  Homicidal Thoughts:  No  Memory:  Immediate;   Good Recent;   Good Remote;   Good  Judgement:  Fair  Insight:  Fair   Psychomotor Activity:  Normal  Concentration:  Concentration: Good and Attention Span: Good  Recall:  Good  Fund of Knowledge:  Fair  Language:  Good  Akathisia:  No  Handed:  Right  AIMS (if indicated):     Assets:  Communication Skills Physical Health  ADL's:  Intact  Cognition:  WNL  Sleep:        Treatment Plan Summary: Daily contact with patient to assess and evaluate symptoms and progress in treatment, Medication management and Plan Discharge home to follow up with [facility]  Continue current home medications Follow up with substance abuse rehab resources given.    Disposition: No evidence of imminent risk to self or others at present.   Patient does not meet criteria for psychiatric inpatient admission.  Davona Kinoshita, NP 11/27/2016 1:36 PM

## 2016-11-27 NOTE — ED Notes (Signed)
SBAR Report received from previous nurse. Pt received calm and visible on unit. Pt denies current SI/ HI, A/V H, depression, anxiety, or pain at this time, and appears otherwise stable and free of distress and reports just wanting to rest. Pt reminded of camera surveillance, q 15 min rounds, and rules of the milieu. Will continue to assess.

## 2016-11-27 NOTE — BHH Counselor (Signed)
Per Nira ConnJason Berry, NP- Patient recommended for AM psych evaluation.   Elmore GuiseJaniah Meyer Arora, LPCA & LCAS Therapeutic Triage Specialist [phone]

## 2016-11-27 NOTE — ED Notes (Signed)
Pt d/c home per MD order. Discharge summary reviewed with pt. Pt verbalizes understanding. Pt denies SI/HI/AVH. Pt signed for personal property and property returned. Pt signed e-signature. Ambulatory off unit with MHT.  

## 2016-11-27 NOTE — BHH Suicide Risk Assessment (Cosign Needed)
Suicide Risk Assessment  Discharge Assessment   [facility]   Principal Problem: Cocaine-induced mood disorder [facility](HCC) Discharge Diagnoses:  Patient Active Problem List   Diagnosis Date Noted  . Polysubstance abuse [F19.10] 11/27/2016  . Cocaine-induced mood disorder (HCC) [F14.94] 11/27/2016  . Severe episode of recurrent major depressive disorder, without psychotic features (HCC) [F33.2]   . Major depressive disorder, single episode, severe without psychotic features (HCC) [F32.2] 11/02/2016  . 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  Neck: Normal range of motion.  Respiratory: Effort normal.  Musculoskeletal: Normal range of motion.  Neurological: He is alert and oriented to person, place, and time.  Psychiatric: He has a normal mood and affect. His speech is normal and behavior is normal. Cognition and memory are normal. He expresses impulsivity.    Review of Systems  Psychiatric/Behavioral: Positive for depression (Stable) and substance abuse (Cocaine, THC). Negative for hallucinations and suicidal ideas. The patient has insomnia (Stable). The patient is not nervous/anxious.     Blood pressure 138/86, pulse 69, temperature 97.6 F (36.4 C), temperature source Oral, resp. rate 20, height 5\' 9"  (1.753 m), weight 104.3 kg (230 lb), SpO2 99 %.Body mass index is 33.97 kg/m.  General Appearance: Fairly Groomed  Eye Contact:  Good  Speech:  Clear and Coherent and Normal Rate  Volume:  Normal  Mood:  Appropriate  Affect:  Appropriate  Thought Process:  Goal Directed  Orientation:  Full (Time, Place, and Person)  Thought Content:  Logical  Suicidal Thoughts:  No  Homicidal Thoughts:  No  Memory:   Immediate;   Good Recent;   Good Remote;   Good  Judgement:  Fair  Insight:  Fair  Psychomotor Activity:  Normal  Concentration:  Concentration: Good and Attention Span: Good  Recall:  Good  Fund of Knowledge:  Fair  Language:  Good  Akathisia:  No  Handed:  Right  AIMS (if indicated):     Assets:  Communication Skills Physical Health  ADL's:  Intact  Cognition:  WNL  Sleep:          Mental Status Per Nursing Assessment::   On Admission:    Suicidal ideation  Demographic Factors:  Male and Unemployed  Loss Factors: NA  Historical Factors: Impulsivity  Risk Reduction Factors:   Religious beliefs about death  Continued Clinical Symptoms:  Alcohol/Substance Abuse/Dependencies  Cognitive Features That Contribute To Risk:  None    Suicide Risk:  Minimal: No identifiable suicidal ideation.  Patients presenting with no risk factors but with morbid ruminations; may be classified as minimal risk based on the severity of the depressive symptoms    Plan Of Care/Follow-up recommendations:  Activity:  As tolerated Diet:  Heart Healthy  Rebecca Cairns, NP 11/27/2016, 1:51 PM

## 2016-11-27 NOTE — Progress Notes (Signed)
CSW spoke with patient at bedside regarding substance abuse treatment options. CSW explained the difference between outpatient and residential treatment, patient verbalized understanding. Patient reported that he has not been to substance abuse treatment in the past and that he is interested now. CSW encouraged patient to follow up with substance abuse treatment options and informed patient about self pay. CSW provided patient with substance abuse treatment resources. CSW inquired if patient had any additional questions, patient reported none.  Celso SickleKimberly Azuri Bozard, LCSWA [facility]  Clinical Social Worker [phone]

## 2016-11-27 NOTE — BH Assessment (Addendum)
Tele Assessment Note   Don Vaughn is an 31 y.o.single male, who voluntarily came into [facility]. Patient reported not feeling safe in his home due to experiences suicidal ideations with a plan to shoot himself with a gun that he had in his home.  Patient stated that he "relapsed" after being discharged from [facility].  Patient reported use of Cocaine and Cannabis on 11/26/2016.  Patient reported taking medications to the facility after his [facility] on 11/04/2016, but not being able to retrieve them from [facility] after his discharge from there.  Patient stated that he has not been able to take his medications for the previous 2  weeks.  Patient denies HI/AVH, self-injurious behaviors, arrests, probation, pending court dates, or aggressive behaviors.  Patient reported experiences of depressive symptoms, such as fatigue, isolation, tearfulness, feelings of worthlessness, guilt, loss of interest in previously enjoyable activities and irritability.    Patient reports recently graduating from college and obtaining a Bachelor of Arts degree.  Patient stated that he currently has full-time employment. Patient reported having various stressors, such as his job, family, friends, and current living situation.  Patient reported receiving prior inpatient treatment for substance use and depression at [facility] ([facility]) and [facility] (11/2016).  Patient stated that he is currently not receiving outpatient treatment any provider.  During assessment, Patient was calm and cooperative.     Patient was dressed in scrubs and sitting in his bed.  Patient was oriented to the time, place, person, and situation.  Patient's eye contact was good and motor activity appeared to consist of freedom of movement.  Patient's speech was logical and coherent.  Patient's level of consciousness was alert.  Patient's mood appeared to be depressed, with feelings of worthless, and low self-esteem.  Patient's thought  process was coherent, relevant, and circumstantial.  Patient reported wanting to receive inpatient treatment for his depression and substance abuse.   Diagnosis: Major depressive disorder, recurrent, severe, without psychotic features Cannabis Use Disorder Stimulant Use Disorder  Per Nira ConnJason Berry, NP--AM psychiatric evaluation recommended.      Past Medical History:  Past Medical History:  Diagnosis Date  . Depression   . Hypertension     Past Surgical History:  Procedure Laterality Date  . EXTERNAL EAR SURGERY Right    "it was cutt off and sutured back on"    Family History:  Family History  Problem Relation Age of Onset  . Mental illness Neg Hx     Social History:  reports that he has been smoking.  He has never used smokeless tobacco. He reports that he uses drugs, including Marijuana. He reports that he does not drink alcohol.  Additional Social History:  Alcohol / Drug Use Pain Medications: Patient denies Prescriptions: Patient denies Over the Counter: Patient denies History of alcohol / drug use?: Yes Substance #1 Name of Substance 1: Cocaine 1 - Age of First Use: Unknown 1 - Amount (size/oz): Unknown 1 - Frequency: Varies 1 - Duration: Ongoing 1 - Last Use / Amount: 11/26/2016 Substance #2 Name of Substance 2: Cannabis 2 - Age of First Use: 17 2 - Amount (size/oz): Varies 2 - Frequency: Unknown 2 - Duration: Unknown  CIWA: CIWA-Ar BP: (!) 195/111 Pulse Rate: (!) 108 COWS:    PATIENT STRENGTHS: (choose at least two) Ability for insight Average or above average intelligence Capable of independent living Communication skills General fund of knowledge Motivation for treatment/growth Physical Health Supportive family/friends Work skills  Allergies: No  Known Allergies  Home Medications:  (Not in a hospital admission)  OB/GYN Status:  No LMP for male patient.  General Assessment Data [facility] of [facility]: [facility] TTS Assessment: In system Is  this a Tele or Face-to-Face Assessment?: Face-to-Face Is this an Initial Assessment or a Re-assessment for this encounter?: Initial Assessment Marital status: Single Maiden name: N/A Is patient pregnant?: No Pregnancy Status: No Living Arrangements:  (Pt. reports living with 2 roomates) Can pt return to current living arrangement?: Yes Admission Status: Voluntary Is patient capable of signing voluntary admission?: Yes Referral Source: Self/Family/Friend Insurance type: None     Crisis Care Plan Living Arrangements:  (Pt. reports living with 2 roomates) Legal Guardian: Other: (Self) Name of Psychiatrist: None reported Name of Therapist: None reported  Education Status Is patient currently in school?: No Current Grade: N/A Highest grade of school patient has completed: Bachelor's Degree Name of school: N/A Contact person: N/A  Risk to self with the past 6 months Suicidal Ideation: Yes-Currently Present Has patient been a risk to self within the past 6 months prior to admission? : Yes Suicidal Intent: Yes-Currently Present Has patient had any suicidal intent within the past 6 months prior to admission? : Yes Is patient at risk for suicide?: Yes Suicidal Plan?: Yes-Currently Present Has patient had any suicidal plan within the past 6 months prior to admission? : Yes Specify Current Suicidal Plan: Patient reports having plans to shoot himself with a gun. Access to Means: Yes Specify Access to Suicidal Means: Patient reports having guns in his home. What has been your use of drugs/alcohol within the last 12 months?: Pt. reports Cocaine and Cannabis Previous Attempts/Gestures: Yes How many times?: 1 Other Self Harm Risks: Patient denies Triggers for Past Attempts: Family contact, Anniversary, Other (Comment), Unpredictable (Pt. report triggers from stress of college) Intentional Self Injurious Behavior: None Family Suicide History: No Recent stressful life event(s): Other  (Comment), Conflict (Comment) (Pt. reports stressors of family, friends, job, & living situ) Persecutory voices/beliefs?: No Depression: Yes Depression Symptoms: Fatigue, Isolating, Loss of interest in usual pleasures, Feeling angry/irritable, Feeling worthless/self pity Substance abuse history and/or treatment for substance abuse?: Yes Suicide prevention information given to non-admitted patients: Not applicable  Risk to Others within the past 6 months Homicidal Ideation: No Does patient have any lifetime risk of violence toward others beyond the six months prior to admission? : No Thoughts of Harm to Others: No Current Homicidal Intent: No Current Homicidal Plan: No Access to Homicidal Means: No (Patient denies) Identified Victim: None History of harm to others?: No Assessment of Violence: On admission Violent Behavior Description: Patient denies Does patient have access to weapons?: Yes (Comment) (Patient reports having access to guns) Criminal Charges Pending?: No (Patient denies) Does patient have a court date: No (Patient denies) Is patient on probation?: No (Patient denies)  Psychosis Hallucinations: None noted Delusions: None noted  Mental Status Report Appearance/Hygiene: In scrubs, Unremarkable Eye Contact: Good Motor Activity: Freedom of movement, Unremarkable Speech: Logical/coherent Level of Consciousness: Alert Mood: Depressed, Worthless, low self-esteem Affect: Sad, Depressed Anxiety Level: None Thought Processes: Coherent, Relevant, Circumstantial Judgement: Unimpaired Orientation: Person, Place, Time, Situation Obsessive Compulsive Thoughts/Behaviors: None  Cognitive Functioning Concentration: Fair Memory: Recent Intact, Remote Intact IQ: Average Insight: Fair Impulse Control: Poor Appetite: Fair Weight Loss: 10 (Pt. reports a loss occurrences during the past 1 and half) Weight Gain: 0 Sleep: No Change Total Hours of Sleep: 5 Vegetative Symptoms:  None  ADLScreening [facility] Assessment Services) Patient's cognitive  ability adequate to safely complete daily activities?: Yes Patient able to express need for assistance with ADLs?: Yes Independently performs ADLs?: Yes (appropriate for developmental age)  Prior Inpatient Therapy Prior Inpatient Therapy: Yes Prior Therapy Dates: 10/2016, 11/2016 Prior Therapy Facilty[facility]Provider([facility] (Per Patient) Reason for Treatment: Depression and substance abuse  Prior Outpatient Therapy Prior Outpatient Therapy: Yes Prior Therapy Dates: Unknown Prior Therapy Facilty/Provider(s): Unknown Reason for Treatment: Depression Does patient have an ACCT team?: No Does patient have Intensive In-House Services?  : No Does patient have [facility] services? : No Does patient have P4CC services?: No  ADL Screening (condition at time of admission) Patient's cognitive ability adequate to safely complete daily activities?: Yes Is the patient deaf or have difficulty hearing?: No Does the patient have difficulty seeing, even when wearing glasses/contacts?: No Does the patient have difficulty concentrating, remembering, or making decisions?: No Patient able to express need for assistance with ADLs?: Yes Does the patient have difficulty dressing or bathing?: No Independently performs ADLs?: Yes (appropriate for developmental age) Does the patient have difficulty walking or climbing stairs?: No Weakness of Legs: None Weakness of Arms/Hands: None  Home Assistive Devices/Equipment Home Assistive Devices/Equipment: None    Abuse/Neglect Assessment (Assessment to be complete while patient is alone) Physical Abuse: Denies Verbal Abuse: Denies Sexual Abuse: Denies Exploitation of patient/patient's resources: Denies Self-Neglect: Denies     Merchant navy officer (For Healthcare) Does Patient Have a Medical Advance Directive?: No Would patient like information on creating a medical advance  directive?: No - Patient declined    Additional Information 1:1 In Past 12 Months?: No CIRT Risk: No Elopement Risk: No Does patient have medical clearance?: Yes     Disposition:  Disposition Initial Assessment Completed for this Encounter: Yes (Per Nira Conn, NP) Disposition of Patient: Other dispositions (AM psych evaluation) Type of inpatient treatment program: Adult Other disposition(s): Other (Comment) (AM psych evaluation)  Talbert Nan 11/27/2016 12:35 AM

## 2016-12-02 ENCOUNTER — Emergency Department (HOSPITAL_COMMUNITY)
Admission: EM | Admit: 2016-12-02 | Discharge: 2016-12-02 | Disposition: A | Discharge status: Home / Self Care | Payer: Self-pay | Attending: Emergency Medicine | Admitting: Emergency Medicine

## 2016-12-02 DIAGNOSIS — Z79899 Other long term (current) drug therapy: Secondary | ICD-10-CM | POA: Insufficient documentation

## 2016-12-02 DIAGNOSIS — F17210 Nicotine dependence, cigarettes, uncomplicated: Secondary | ICD-10-CM | POA: Insufficient documentation

## 2016-12-02 DIAGNOSIS — R45851 Suicidal ideations: Secondary | ICD-10-CM | POA: Insufficient documentation

## 2016-12-02 DIAGNOSIS — F1494 Cocaine use, unspecified with cocaine-induced mood disorder: Secondary | ICD-10-CM | POA: Insufficient documentation

## 2016-12-02 DIAGNOSIS — F1410 Cocaine abuse, uncomplicated: Secondary | ICD-10-CM | POA: Diagnosis present

## 2016-12-02 DIAGNOSIS — F1994 Other psychoactive substance use, unspecified with psychoactive substance-induced mood disorder: Secondary | ICD-10-CM | POA: Diagnosis present

## 2016-12-02 LAB — RAPID URINE DRUG SCREEN, HOSP PERFORMED
Amphetamines: NOT DETECTED
Barbiturates: NOT DETECTED
Benzodiazepines: NOT DETECTED
Cocaine: NOT DETECTED
Opiates: NOT DETECTED
Tetrahydrocannabinol: NOT DETECTED

## 2016-12-02 MED ORDER — ACETAMINOPHEN 500 MG PO TABS: 1000.0000 mg | ORAL_TABLET | Freq: Four times a day (QID) | ORAL | Status: DC | PRN

## 2016-12-02 MED ORDER — HYDROXYZINE HCL 25 MG PO TABS: 25.0000 mg | ORAL_TABLET | Freq: Three times a day (TID) | ORAL | Status: DC | PRN

## 2016-12-02 MED ORDER — GABAPENTIN 300 MG PO CAPS: 300.0000 mg | ORAL_CAPSULE | Freq: Three times a day (TID) | ORAL | Status: DC

## 2016-12-02 MED ORDER — ZIPRASIDONE HCL 20 MG PO CAPS: 40.0000 mg | ORAL_CAPSULE | Freq: Every day | ORAL | Status: DC

## 2016-12-02 MED ORDER — CLONIDINE HCL 0.1 MG PO TABS: 0.1000 mg | ORAL_TABLET | Freq: Two times a day (BID) | ORAL | Status: DC

## 2016-12-02 MED ORDER — HYDROCHLOROTHIAZIDE 25 MG PO TABS
25.0000 mg | ORAL_TABLET | Freq: Every day | ORAL | Status: DC
  Filled 2016-12-02: qty 1

## 2016-12-02 MED ORDER — AMLODIPINE BESYLATE 5 MG PO TABS
10.0000 mg | ORAL_TABLET | Freq: Every day | ORAL | Status: DC
  Administered 2016-12-02: 10 mg via ORAL
  Filled 2016-12-02: qty 2

## 2016-12-02 MED ORDER — DOXYLAMINE SUCCINATE (SLEEP) 25 MG PO TABS
25.0000 mg | ORAL_TABLET | Freq: Every evening | ORAL | Status: DC | PRN
  Filled 2016-12-02: qty 1

## 2016-12-02 MED ORDER — BUPROPION HCL ER (XL) 150 MG PO TB24: 150.0000 mg | ORAL_TABLET | Freq: Every day | ORAL | Status: DC

## 2016-12-02 NOTE — BH Assessment (Signed)
[facility] Assessment Progress Note  Per Mojeed Akintayo, MD, this pt does not require psychiatric hospitalization at this time.  [facility] with referral information for area substance abuse treatment providers.  This has been included in pt's discharge instructions.  Pt's nurse, Cynthia, has been notified.  Jenni Thew, MA Triage Specialist [phone]     

## 2016-12-02 NOTE — ED Triage Notes (Signed)
Suicidal patient who presents voluntary to the ED with complaints of suicidal ideation and plan to cut his wrists.  Patient reports he is not taking his meds because they make him "feel funny."  Patient denies HI, AVH.  He is calm and cooperative at this time.

## 2016-12-02 NOTE — BH Assessment (Signed)
[facility] Assessment Progress Note Case was staffed with Shaune PollackLord DNP who reported patient did not meet inpatient criteria and recommended patient be discharged later this date.

## 2016-12-02 NOTE — BH Assessment (Addendum)
Assessment Note  Don Vaughn is an 31 y.o. male that presents this date (per note review on admission) with thoughts of self harm with a plan to cut his wrist. Patient presents with a flat affect and is vague in reference to his S/I during assessment stating "he felt like hurting himself earlier this date" but would not elaborate on specifics at the time of assessment. Patient per note review, has a history of inpatient admissions associated with SA issues and depression. Patient was last seen at [facility] on 11/27/16 to address SA issues and depression. Patient stated that he always relapses soon after being discharged and is not interested in following up with aftercare stating "none of that works." Patient renders limited information and answers mostly "yes" and "no" to this writer's questions. Patient is requesting long term care to assist with his SA issues although he denies current use. Patient's UDS was negative this date but tested positive for Cocaine and THC on his last admission. Patient denies HI/AVH, self-injurious behaviors, aggressive behaviors or currently being on probation. Patient did states he had a upcoming court date but would not elaborate on that incident or court date. Patient reports ongoing depression with symptoms to include: fatigue, isolation, tearfulness and feelings of worthlessness. Patient states he is not currently compliant with his medication regimen due to them making him "feel funny." Patient reports having various stressors, such as loss of employment, lack of family support and stress with his current relationship. Patient reported receiving prior inpatient treatment for substance use and depression at [facility] (10/2016) and [facility] (11/2016).  Patient stated that he is currently not receiving outpatient treatment any provider. During assessment, patient was calm and cooperative but did state he was not interested in services if [facility] could not get him into a residential  treatment program. Patient was oriented to the time, place, person, and situation. Patient's thought process was coherent, relevant, and circumstantial. Patient was very vague in reference to what services he was in need of. Patient stated "If you can't help me then let me go." Patient is now denying any S/I, H/I or AVH. Case was staffed with Shaune Pollack DNP who reported patient did not meet inpatient criteria and recommended patient be discharged later this date.   Diagnosis: MDD without psychotic features, moderate Polysubstance abuse  Past Medical History:  Past Medical History:  Diagnosis Date  . Depression   . Hypertension     Past Surgical History:  Procedure Laterality Date  . EXTERNAL EAR SURGERY Right    "it was cutt off and sutured back on"    Family History:  Family History  Problem Relation Age of Onset  . Mental illness Neg Hx     Social History:  reports that he has been smoking.  He has never used smokeless tobacco. He reports that he uses drugs, including Marijuana. He reports that he does not drink alcohol.  Additional Social History:  Alcohol / Drug Use Pain Medications: Patient denies Prescriptions: Patient denies Over the Counter: Patient denies History of alcohol / drug use?: Yes Longest period of sobriety (when/how long): 1 year 2017 Negative Consequences of Use:  (Denies) Withdrawal Symptoms:  (Denies) Substance #1 Name of Substance 1: Cocaine 1 - Age of First Use: Unknown 1 - Amount (size/oz): Unknown 1 - Frequency: Varies 1 - Duration: Denies current use 1 - Last Use / Amount: Denies current use Substance #2 Name of Substance 2: Cannabis 2 - Age of First Use: 17 2 -  Amount (size/oz): Varies 2 - Frequency: Unknown 2 - Duration: Unknown 2 - Last Use / Amount: Patient denies use  CIWA: CIWA-Ar BP: (!) 165/108 Pulse Rate: (!) 57 COWS:    Allergies: No Known Allergies  Home Medications:  (Not in a hospital admission)  OB/GYN Status:  No LMP for  male patient.  General Assessment Data [facility] of [facility] Assessment: In system Is this a Tele or Face-to-Face Assessment?: Face-to-Face Is this an Initial Assessment or a Re-assessment for this encounter?: Initial Assessment Marital status: Single Maiden name: NA Is patient pregnant?: No Pregnancy Status: No Living Arrangements: Alone Can pt return to current living arrangement?: Yes Admission Status: Voluntary Is patient capable of signing voluntary admission?: Yes Referral Source: Self/Family/Friend Insurance type: None  Medical Screening Exam [facility] Walk[facility] ONLY) Medical Exam completed: Yes  Crisis Care Plan Living Arrangements: Alone Legal Guardian:  (Self) Name of Psychiatrist: None reported Name of Therapist: None reported  Education Status Is patient currently in school?: No Current Grade:  (NA) Highest grade of school patient has completed:  (Some college) Name of school: N/A Contact person: N/A  Risk to self with the past 6 months Suicidal Ideation: Yes-Currently Present Has patient been a risk to self within the past 6 months prior to admission? : Yes Suicidal Intent: Yes-Currently Present Has patient had any suicidal intent within the past 6 months prior to admission? : Yes Is patient at risk for suicide?: Yes Suicidal Plan?: Yes-Currently Present Has patient had any suicidal plan within the past 6 months prior to admission? : Yes Specify Current Suicidal Plan: Cut wrist Access to Means: No Specify Access to Suicidal Means: Pt denies having access What has been your use of drugs/alcohol within the last 12 months?: Pt denies current use Previous Attempts/Gestures: Yes How many times?: 1 Other Self Harm Risks: Pt denies Triggers for Past Attempts: Unknown Intentional Self Injurious Behavior: None Family Suicide History: No Recent stressful life event(s): Other (Comment) (Pt states his medications aren't working ) Persecutory voices/beliefs?:  No Depression: Yes Depression Symptoms: Feeling worthless/self pity Substance abuse history and/or treatment for substance abuse?: No Suicide prevention information given to non-admitted patients: Not applicable  Risk to Others within the past 6 months Homicidal Ideation: No Does patient have any lifetime risk of violence toward others beyond the six months prior to admission? : No Thoughts of Harm to Others: No Current Homicidal Intent: No Current Homicidal Plan: No Access to Homicidal Means: No Identified Victim: NA History of harm to others?: No Assessment of Violence: None Noted Violent Behavior Description: NA Does patient have access to weapons?: No Criminal Charges Pending?: No Does patient have a court date: No Is patient on probation?: No  Psychosis Hallucinations: None noted Delusions: None noted  Mental Status Report Appearance/Hygiene: In scrubs Eye Contact: Poor Motor Activity: Freedom of movement Speech: Logical/coherent Level of Consciousness: Irritable Mood: Anxious Affect: Appropriate to circumstance Anxiety Level: None Thought Processes: Coherent, Relevant Judgement: Unimpaired Orientation: Person, Place, Time Obsessive Compulsive Thoughts/Behaviors: None  Cognitive Functioning Concentration: Fair Memory: Recent Intact, Remote Intact IQ: Average Insight: Poor Impulse Control: Poor Appetite: Fair Weight Loss: 0 Weight Gain: 0 Sleep: No Change Total Hours of Sleep: 7 Vegetative Symptoms: None  ADLScreening [facility] Assessment Services) Patient's cognitive ability adequate to safely complete daily activities?: Yes Patient able to express need for assistance with ADLs?: Yes Independently performs ADLs?: Yes (appropriate for developmental age)  Prior Inpatient Therapy Prior Inpatient Therapy: Yes Prior Therapy Dates: 2018 Prior  The[facility] Reason for Treatment: MH issues  Prior Outpatient  Therapy Prior Outpatient Therapy: Yes Prior Therapy Dates: Unknown Prior Therapy Facilty/Provider(s): Pt reports "different providers" Reason for Treatment: MH issues Does patient have an ACCT team?: No Does patient have Intensive In-House Services?  : No Does patient have [facility] services? : No Does patient have P4CC services?: No  ADL Screening (condition at time of admission) Patient's cognitive ability adequate to safely complete daily activities?: Yes Is the patient deaf or have difficulty hearing?: No Does the patient have difficulty seeing, even when wearing glasses/contacts?: No Does the patient have difficulty concentrating, remembering, or making decisions?: No Patient able to express need for assistance with ADLs?: Yes Does the patient have difficulty dressing or bathing?: No Independently performs ADLs?: Yes (appropriate for developmental age) Does the patient have difficulty walking or climbing stairs?: No Weakness of Legs: None Weakness of Arms/Hands: None  Home Assistive Devices/Equipment Home Assistive Devices/Equipment: None  Therapy Consults (therapy consults require a physician order) PT Evaluation Needed: No OT Evalulation Needed: No SLP Evaluation Needed: No Abuse/Neglect Assessment (Assessment to be complete while patient is alone) Physical Abuse: Denies Verbal Abuse: Denies Sexual Abuse: Denies Exploitation of patient/patient's resources: Denies Self-Neglect: Denies Values / Beliefs Cultural Requests During Hospitalization: None Spiritual Requests During Hospitalization: None Consults Spiritual Care Consult Needed: No Social Work Consult Needed: No Merchant navy officerAdvance Directives (For Healthcare) Does Patient Have a Medical Advance Directive?: No Would patient like information on creating a medical advance directive?: No - Patient declined    Additional Information 1:1 In Past 12 Months?: No CIRT Risk: No Elopement Risk: No Does patient have medical  clearance?: Yes     Disposition: Case was staffed with Shaune PollackLord DNP who reported patient did not meet inpatient criteria and recommended patient be discharged later this date.   Disposition Initial Assessment Completed for this Encounter: Yes Disposition of Patient: Other dispositions Type of inpatient treatment program: Adult Other disposition(s):  (Pt to be D/Ced this date)  On Site Evaluation by:   Reviewed with Physician:    Alfredia Fergusonavid L Wateen Varon 12/02/2016 10:35 AM

## 2016-12-02 NOTE — Discharge Instructions (Signed)
To help you maintain a sober lifestyle, a substance abuse treatment program may be beneficial to you.  Contact one of the following facilities at your earliest opportunity to ask about enrolling: ° °RESIDENTIAL PROGRAMS: ° °     [facility] °     [address] °     [place], [place] [address] °     [phone] ° °     [facility] °     [address] °     [place], [place] [address] °     [phone] ° °OUTPATIENT PROGRAMS: ° °     [facility] °     [address] °     [place], [place] [address] °     [phone] °     New patients are seen at the walk-in clinic every Tuesday from 9:00 am - 12:00 pm °

## 2016-12-02 NOTE — ED Provider Notes (Signed)
[facility]-EMER[facility]ENCY DEPT Provider Note   CSN: [id number] Arrival date & time: 12/02/16  0850     History   Chief Complaint Chief Complaint  Patient presents with  . Suicidal    HPI Don Vaughn is a 31 y.o. male.  31 yoM with a cc of SI.  Attempted to slit his wrists this morning, but was noted by his roomates and tackled.  Took away his cutting tool and told him he needed to come to the ED for help.  Patient feels worsening relationships with his family is the primary cause.  Not taking his meds because they make him tired.    The history is provided by the patient.  Illness  This is a new problem. The current episode started more than 1 week ago. The problem occurs constantly. The problem has been gradually worsening. Pertinent negatives include no chest pain, no abdominal pain, no headaches and no shortness of breath. Nothing aggravates the symptoms. Nothing relieves the symptoms. He has tried nothing for the symptoms. The treatment provided no relief.    Past Medical History:  Diagnosis Date  . Depression   . Hypertension     Patient Active Problem List   Diagnosis Date Noted  . Cocaine abuse 12/02/2016  . Polysubstance abuse 11/27/2016  . 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 Severe episode of recurrent major depressive disorder, without psychotic features (HCC)   . Major depressive disorder, single episode, severe without psychotic features (HCC) 11/02/2016  . Stimulant use disorder 11/01/2016    Past Surgical History:  Procedure Laterality Date  . EXTERNAL EAR SURGERY Right    "it was cutt off and sutured back on"       Home Medications    Prior to Admission medications   Medication Sig Start Date End Date Taking? Authorizing Provider  amLODipine (NORVASC) 10 MG tablet Take 10 mg by mouth daily.   Yes [provider]  buPROPion (WELLBUTRIN XL) 300 MG 24 hr tablet Take 300 mg by mouth daily.   Yes [provider]  cloNIDine  (CATAPRES) 0.1 MG tablet Take 1 tablet (0.1 mg total) by mouth 2 (two) times daily. For high blood pressure 11/08/16  Yes Nwoko, Agnes I, NP  gabapentin (NEURONTIN) 300 MG capsule Take 300 mg by mouth 3 (three) times daily.   Yes [provider]  hydrochlorothiazide (HYDRODIURIL) 25 MG tablet Take 25 mg by mouth daily.   Yes [provider]  hydrOXYzine (ATARAX/VISTARIL) 25 MG tablet Take 1 tablet (25 mg total) by mouth 3 (three) times daily as needed for anxiety. 11/08/16  Yes Armandina Stammer I, NP  ziprasidone (GEODON) 40 MG capsule Take 40 mg by mouth at bedtime.   Yes [provider]  amLODipine (NORVASC) 5 MG tablet Take 1 tablet (5 mg total) by mouth daily. For elevated blood pressure Patient not taking: Reported on 12/02/2016 11/08/16   Armandina Stammer I, NP  buPROPion (WELLBUTRIN XL) 150 MG 24 hr tablet Take 1 tablet (150 mg total) by mouth daily. For depression Patient not taking: Reported on 12/02/2016 11/09/16   Armandina Stammer I, NP  traZODone (DESYREL) 150 MG tablet Take 1 tablet (150 mg total) by mouth at bedtime. For sleep Patient not taking: Reported on 12/02/2016 11/08/16   Sanjuana Kava, NP    Family History Family History  Problem Relation Age of Onset  . Mental illness Neg Hx     Social History Social History  Substance Use Topics  . Smoking status:  Current Every Day Smoker  . Smokeless tobacco: Never Used  . Alcohol use No     Allergies   Patient has no known allergies.   Review of Systems Review of Systems  Constitutional: Negative for chills and fever.  HENT: Negative for congestion and facial swelling.   Eyes: Negative for discharge and visual disturbance.  Respiratory: Negative for shortness of breath.   Cardiovascular: Negative for chest pain and palpitations.  Gastrointestinal: Negative for abdominal pain, diarrhea and vomiting.  Musculoskeletal: Negative for arthralgias and myalgias.  Skin: Negative for color change and rash.  Neurological:  Negative for tremors, syncope and headaches.  Psychiatric/Behavioral: Positive for dysphoric mood and suicidal ideas. Negative for confusion.     Physical Exam Updated Vital Signs BP 112/61 (BP Location: Right Arm)   Pulse 90   Temp 98.3 F (36.8 C) (Oral)   Resp 20   SpO2 95%   Physical Exam  Constitutional: He is oriented to person, place, and time. He appears well-developed and well-nourished.  HENT:  Head: Normocephalic and atraumatic.  Eyes: Pupils are equal, round, and reactive to light. EOM are normal.  Neck: Normal range of motion. Neck supple. No JVD present.  Cardiovascular: Normal rate and regular rhythm.  Exam reveals no gallop and no friction rub.   No murmur heard. Pulmonary/Chest: No respiratory distress. He has no wheezes.  Abdominal: He exhibits no distension and no mass. There is no tenderness. There is no rebound and no guarding.  Musculoskeletal: Normal range of motion.  Neurological: He is alert and oriented to person, place, and time.  Skin: No rash noted. No pallor.  Psychiatric: His behavior is normal. His affect is blunt. He expresses suicidal ideation. He expresses suicidal plans.  Nursing note and vitals reviewed.    ED Treatments / Results  Labs (all labs ordered are listed, but only abnormal results are displayed) Labs Reviewed  RAPID URINE DRUG SCREEN, HOSP PERFORMED    EKG  EKG Interpretation None       Radiology No results found.  Procedures Procedures (including critical care time)  Medications Ordered in ED Medications - No data to display   Initial Impression / Assessment and Plan / ED Course  I have reviewed the triage vital signs and the nursing notes.  Pertinent labs & imaging results that were available during my care of the patient were reviewed by me and considered in my medical decision making (see chart for details).     31 yo M with SI. Hx of depression, recently here for SI.  As patient had an attempt this  morning will have TTS eval.  Recent medical screening with labs, not on meds, feel unlikely to discover hidden pathology in an otherwise healthy young male.  Hold off on labs at this time.  I feel he is medically clear.    The patients results and plan were reviewed and discussed.   Any x-rays performed were independently reviewed by myself.   Differential diagnosis were considered with the presenting HPI.  Medications - No data to display  Vitals:   12/02/16 0859 12/02/16 1041 12/02/16 1057  BP: (!) 165/108 (!) 166/99 112/61  Pulse: (!) 57 (!) 56 90  Resp: 14 16 20   Temp: 98.9 F (37.2 C) 98 F (36.7 C) 98.3 F (36.8 C)  TempSrc:  Oral Oral  SpO2: 97% 100% 95%    Final diagnoses:  Cocaine-induced mood disorder (HCC)  Cocaine abuse   Final Clinical Impressions(s) / ED Diagnoses  Final diagnoses:  Cocaine-induced mood disorder (HCC)  Cocaine abuse    New Prescriptions Discharge Medication List as of 12/02/2016 11:00 AM       Melene PlanFloyd, Shaila Gilchrest, DO 12/02/16 1918

## 2016-12-02 NOTE — ED Notes (Addendum)
Patient belongings placed in locker #29.  Patient wanded by security.

## 2016-12-11 ENCOUNTER — Encounter (HOSPITAL_COMMUNITY): Payer: Self-pay | Admitting: Behavioral Health

## 2016-12-11 ENCOUNTER — Encounter (HOSPITAL_COMMUNITY): Payer: Self-pay | Admitting: Obstetrics and Gynecology

## 2016-12-11 ENCOUNTER — Inpatient Hospital Stay (HOSPITAL_COMMUNITY)
Admission: AD | Admit: 2016-12-11 | Discharge: 2016-12-16 | DRG: 885 | Disposition: A | Discharge status: Home / Self Care | Payer: No Typology Code available for payment source | Source: Intra-hospital | Attending: Psychiatry | Admitting: Psychiatry

## 2016-12-11 ENCOUNTER — Emergency Department (HOSPITAL_COMMUNITY)
Admission: EM | Admit: 2016-12-11 | Discharge: 2016-12-11 | Disposition: A | Discharge status: Other Acute Inpatient Hospital | Payer: Self-pay | Attending: Emergency Medicine | Admitting: Emergency Medicine

## 2016-12-11 DIAGNOSIS — I10 Essential (primary) hypertension: Secondary | ICD-10-CM | POA: Diagnosis present

## 2016-12-11 DIAGNOSIS — F1910 Other psychoactive substance abuse, uncomplicated: Secondary | ICD-10-CM | POA: Diagnosis present

## 2016-12-11 DIAGNOSIS — F332 Major depressive disorder, recurrent severe without psychotic features: Principal | ICD-10-CM | POA: Diagnosis present

## 2016-12-11 DIAGNOSIS — F1490 Cocaine use, unspecified, uncomplicated: Secondary | ICD-10-CM | POA: Diagnosis present

## 2016-12-11 DIAGNOSIS — F1220 Cannabis dependence, uncomplicated: Secondary | ICD-10-CM | POA: Diagnosis not present

## 2016-12-11 DIAGNOSIS — F1590 Other stimulant use, unspecified, uncomplicated: Secondary | ICD-10-CM | POA: Diagnosis present

## 2016-12-11 DIAGNOSIS — Z560 Unemployment, unspecified: Secondary | ICD-10-CM | POA: Diagnosis not present

## 2016-12-11 DIAGNOSIS — Z915 Personal history of self-harm: Secondary | ICD-10-CM

## 2016-12-11 DIAGNOSIS — F1994 Other psychoactive substance use, unspecified with psychoactive substance-induced mood disorder: Secondary | ICD-10-CM | POA: Diagnosis present

## 2016-12-11 DIAGNOSIS — F17210 Nicotine dependence, cigarettes, uncomplicated: Secondary | ICD-10-CM | POA: Diagnosis not present

## 2016-12-11 DIAGNOSIS — X789XXA Intentional self-harm by unspecified sharp object, initial encounter: Secondary | ICD-10-CM | POA: Insufficient documentation

## 2016-12-11 DIAGNOSIS — F1410 Cocaine abuse, uncomplicated: Secondary | ICD-10-CM | POA: Diagnosis not present

## 2016-12-11 DIAGNOSIS — R45851 Suicidal ideations: Secondary | ICD-10-CM | POA: Diagnosis present

## 2016-12-11 DIAGNOSIS — Y999 Unspecified external cause status: Secondary | ICD-10-CM | POA: Insufficient documentation

## 2016-12-11 DIAGNOSIS — Y939 Activity, unspecified: Secondary | ICD-10-CM | POA: Insufficient documentation

## 2016-12-11 DIAGNOSIS — Y9259 Other trade areas as the place of occurrence of the external cause: Secondary | ICD-10-CM | POA: Insufficient documentation

## 2016-12-11 DIAGNOSIS — F322 Major depressive disorder, single episode, severe without psychotic features: Secondary | ICD-10-CM | POA: Diagnosis present

## 2016-12-11 DIAGNOSIS — T1491XA Suicide attempt, initial encounter: Secondary | ICD-10-CM | POA: Insufficient documentation

## 2016-12-11 DIAGNOSIS — Z79899 Other long term (current) drug therapy: Secondary | ICD-10-CM | POA: Insufficient documentation

## 2016-12-11 DIAGNOSIS — F1290 Cannabis use, unspecified, uncomplicated: Secondary | ICD-10-CM | POA: Diagnosis present

## 2016-12-11 LAB — CBC WITH DIFFERENTIAL/PLATELET
Basophils Absolute: 0 10*3/uL (ref 0.0–0.1)
Basophils Relative: 1 %
Eosinophils Absolute: 0.1 10*3/uL (ref 0.0–0.7)
Eosinophils Relative: 3 %
HCT: 41.3 % (ref 39.0–52.0)
Hemoglobin: 14.5 g/dL (ref 13.0–17.0)
Lymphocytes Relative: 27 %
Lymphs Abs: 1.3 10*3/uL (ref 0.7–4.0)
MCH: 29.1 pg (ref 26.0–34.0)
MCHC: 35.1 g/dL (ref 30.0–36.0)
MCV: 82.8 fL (ref 78.0–100.0)
Monocytes Absolute: 0.3 10*3/uL (ref 0.1–1.0)
Monocytes Relative: 6 %
Neutro Abs: 3.1 10*3/uL (ref 1.7–7.7)
Neutrophils Relative %: 63 %
Platelets: 230 10*3/uL (ref 150–400)
RBC: 4.99 MIL/uL (ref 4.22–5.81)
RDW: 14.9 % (ref 11.5–15.5)
WBC: 4.9 10*3/uL (ref 4.0–10.5)

## 2016-12-11 LAB — RAPID URINE DRUG SCREEN, HOSP PERFORMED
Amphetamines: POSITIVE — AB
Barbiturates: NOT DETECTED
Benzodiazepines: NOT DETECTED
Cocaine: POSITIVE — AB
Opiates: NOT DETECTED
Tetrahydrocannabinol: POSITIVE — AB

## 2016-12-11 LAB — BASIC METABOLIC PANEL
Anion gap: 10 (ref 5–15)
BUN: 22 mg/dL — ABNORMAL HIGH (ref 6–20)
CO2: 25 mmol/L (ref 22–32)
Calcium: 9.2 mg/dL (ref 8.9–10.3)
Chloride: 105 mmol/L (ref 101–111)
Creatinine, Ser: 1.26 mg/dL — ABNORMAL HIGH (ref 0.61–1.24)
GFR calc Af Amer: >60 mL/min (ref >60)
GFR calc non Af Amer: >60 mL/min (ref >60)
Glucose, Bld: 127 mg/dL — ABNORMAL HIGH (ref 65–99)
Potassium: 3.4 mmol/L — ABNORMAL LOW (ref 3.5–5.1)
Sodium: 140 mmol/L (ref 135–145)

## 2016-12-11 LAB — ETHANOL: Alcohol, Ethyl (B): <5 mg/dL (ref <5)

## 2016-12-11 MED ORDER — MAGNESIUM HYDROXIDE 400 MG/5ML PO SUSP: 30.0000 mL | Freq: Every day | ORAL | Status: DC | PRN

## 2016-12-11 MED ORDER — GABAPENTIN 300 MG PO CAPS: 300.0000 mg | ORAL_CAPSULE | Freq: Three times a day (TID) | ORAL | Status: DC

## 2016-12-11 MED ORDER — PNEUMOCOCCAL VAC POLYVALENT 25 MCG/0.5ML IJ INJ: 0.5000 mL | INJECTION | INTRAMUSCULAR | Status: DC

## 2016-12-11 MED ORDER — AMLODIPINE BESYLATE 5 MG PO TABS
10.0000 mg | ORAL_TABLET | Freq: Every day | ORAL | Status: DC
  Administered 2016-12-11: 10 mg via ORAL
  Filled 2016-12-11: qty 2

## 2016-12-11 MED ORDER — HYDROXYZINE HCL 25 MG PO TABS
25.0000 mg | ORAL_TABLET | Freq: Four times a day (QID) | ORAL | Status: DC | PRN
  Administered 2016-12-12 – 2016-12-15 (×2): 25 mg via ORAL
  Filled 2016-12-11 (×3): qty 1
  Filled 2016-12-11: qty 10
  Filled 2016-12-11: qty 1

## 2016-12-11 MED ORDER — ALUM & MAG HYDROXIDE-SIMETH 200-200-20 MG/5ML PO SUSP
30.0000 mL | ORAL | Status: DC | PRN
Start: 2016-12-11 — End: 2016-12-16

## 2016-12-11 MED ORDER — HYDROXYZINE HCL 25 MG PO TABS: 25.0000 mg | ORAL_TABLET | Freq: Three times a day (TID) | ORAL | Status: DC | PRN

## 2016-12-11 MED ORDER — ENSURE ENLIVE PO LIQD: 237.0000 mL | Freq: Two times a day (BID) | ORAL | Status: DC

## 2016-12-11 MED ORDER — ZIPRASIDONE HCL 20 MG PO CAPS: 40.0000 mg | ORAL_CAPSULE | Freq: Every day | ORAL | Status: DC

## 2016-12-11 MED ORDER — ACETAMINOPHEN 325 MG PO TABS: 650.0000 mg | ORAL_TABLET | Freq: Four times a day (QID) | ORAL | Status: DC | PRN

## 2016-12-11 MED ORDER — CLONIDINE HCL 0.1 MG PO TABS
0.1000 mg | ORAL_TABLET | Freq: Two times a day (BID) | ORAL | Status: DC
  Administered 2016-12-11: 0.1 mg via ORAL
  Filled 2016-12-11: qty 1

## 2016-12-11 MED ORDER — TRAZODONE HCL 50 MG PO TABS
50.0000 mg | ORAL_TABLET | Freq: Every evening | ORAL | Status: DC | PRN
  Administered 2016-12-12 – 2016-12-15 (×2): 50 mg via ORAL
  Filled 2016-12-11 (×3): qty 1
  Filled 2016-12-11: qty 7
  Filled 2016-12-11: qty 1

## 2016-12-11 MED ORDER — HYDROCHLOROTHIAZIDE 25 MG PO TABS
25.0000 mg | ORAL_TABLET | Freq: Every day | ORAL | Status: DC
  Administered 2016-12-12: 25 mg via ORAL
  Filled 2016-12-11 (×5): qty 1
  Filled 2016-12-11: qty 10
  Filled 2016-12-11 (×2): qty 1

## 2016-12-11 MED ORDER — BUPROPION HCL ER (XL) 150 MG PO TB24
300.0000 mg | ORAL_TABLET | Freq: Every day | ORAL | Status: DC
  Administered 2016-12-11: 300 mg via ORAL
  Filled 2016-12-11: qty 2

## 2016-12-11 NOTE — Progress Notes (Signed)
D.  Pt has remained in bed, newly admitted to unit.  No complaints voiced.  Minimal interaction on unit.  A.  Support and encouragement offered  R.  Will continue to monitor, Pt remains safe.

## 2016-12-11 NOTE — ED Triage Notes (Signed)
Pt presents to the ED post suicide attempt. Pt states he was going to try to cut his wrists and was tackled by the police. Pt is voluntary with [facility].  Pt states he had a falling out with his parents and is living in a hotel.

## 2016-12-11 NOTE — BH Assessment (Addendum)
Tele Assessment Note   Don ShockMichael Vaughn is an 31 y.o. male, who admits to attempting to cut his wrists in a suicide attempt, was tackled by a roommate, police were called and pt was brought to the hospital. Pt states that he has decided that he no longer wants to live, "I did not ask to be born and life is pointless anyway". "I did a pros and cons list and it really does not make sense to be alive anymore. I am distant from my family, and they would not say it, but I think that it would be better for everyone if I were dead. They are all close and it is awkward when I am around. I Don't really like people". 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 He states that he has no interests or friends who matter to him, although he has roommates with whom he lives in a hotel.  Pt admits to some substance abuse--1/20 of a gram of meth every month, rarely marijuana, and he used cocaine last week  He states that he has tried Wellbutrin and Risperdal (was paranoid at one point, but now says, "If people were after me, I would not care".) NO medications have helped. [facility], [facility], and [facility], and nothing has helped.  MSE: Pt is casually dressed, alert, oriented x4 with normal speech and normal motor behavior. Eye contact is good. Pt's mood is depressed and affect is depressed and blunted. Affect is congruent with mood. Thought process is coherent and relevant. There is no indication Pt is currently responding to internal stimuli or experiencing delusional thought content. Pt was cooperative throughout assessment. Pt is currently unable to contract for safety outside the hospital and is willing to get inpatient psychiatric treatment.  Per Jacki ConesLaurie, NP, IP treatment is recommended. Per Miki KinsLinsey, AC, [facility]   Diagnosis: MDD, recurrent, severe without psychotic features,  substance abuse disorder  Past Medical History:  Past Medical History:  Diagnosis Date  . Depression   . Hypertension     Past Surgical History:  Procedure Laterality Date  . EXTERNAL EAR SURGERY Right    "it was cutt off and sutured back on"    Family History:  Family History  Problem Relation Age of Onset  . Mental illness Neg Hx     Social History:  reports that he has been smoking.  He has never used smokeless tobacco. He reports that he uses drugs, including Marijuana. He reports that he does not drink alcohol.  Additional Social History:  Alcohol / Drug Use Pain Medications: denies Prescriptions: denies Over the Counter: denies History of alcohol / drug use?:  (denies) Longest period of sobriety (when/how long):  (denies) Substance #1 Name of Substance 1: Cocaine 1 - Age of First Use: Unknown 1 - Amount (size/oz): Unknown 1 - Frequency: used 1 x 1 - Duration: Denies current use 1 - Last Use / Amount: Denies current use Substance #2 Name of Substance 2: Cannabis 2 - Age of First Use: 17 2 - Amount (size/oz): Varies 2 - Frequency: 1x/ 60 mo 2 - Duration: Unknown 2 - Last Use / Amount: Patient denies use Substance #3 Name of Substance 3: meth 3 - Age of First Use: unk 3 - Amount (size/oz): 1/10 gram 3 - Frequency: 1x/mo 3 - Duration: past year 3 -  Last Use / Amount: yesterday  CIWA: CIWA-Ar BP: (!) 158/92 Pulse Rate: 79 COWS:    PATIENT STRENGTHS: (choose at least two) Ability for insight Average or above average intelligence Capable of independent living Communication skills Religious Affiliation Work skills  Allergies: No Known Allergies  Home Medications:  (Not in a hospital admission)  OB/GYN Status:  No LMP for male patient.  General Assessment Data Location of Assessment: [facility] ED TTS Assessment: In system Is this a Tele or Face-to-Face Assessment?: Face-to-Face Is this an Initial Assessment or a Re-assessment for this encounter?:  Initial Assessment Marital status: Single Living Arrangements: Alone, Non-relatives/Friends (hotel, saving for a place) Can pt return to current living arrangement?: Yes Admission Status: Voluntary Is patient capable of signing voluntary admission?: Yes Referral Source: Self/Family/Friend Insurance type: SP  Medical Screening Exam [facility] Walk-in ONLY) Medical Exam completed: No  Crisis Care Plan Living Arrangements: Alone, Non-relatives/Friends (hotel, saving for a place) Legal Guardian:  (none) Name of Psychiatrist: None reported Name of Therapist: None reported  Education Status Is patient currently in school?: No Highest grade of school patient has completed: Bachelor's Degree Name of school: N/A Contact person: N/A  Risk to self with the past 6 months Suicidal Ideation: Yes-Currently Present Has patient been a risk to self within the past 6 months prior to admission? : Yes Suicidal Intent: Yes-Currently Present Has patient had any suicidal intent within the past 6 months prior to admission? : Yes Is patient at risk for suicide?: Yes Suicidal Plan?: Yes-Currently Present Has patient had any suicidal plan within the past 6 months prior to admission? : Yes Specify Current Suicidal Plan: cut wrists Access to Means: Yes Specify Access to Suicidal Means: knife What has been your use of drugs/alcohol within the last 12 months?: denies current problem Previous Attempts/Gestures: Yes How many times?: 3 Other Self Harm Risks: denies Triggers for Past Attempts: Unknown Intentional Self Injurious Behavior: None Family Suicide History: No Recent stressful life event(s): Conflict (Comment), Financial Problems (family) Persecutory voices/beliefs?: No Depression: Yes Depression Symptoms: Feeling worthless/self pity, Feeling angry/irritable, Loss of interest in usual pleasures, Fatigue, Isolating, Despondent Substance abuse history and/or treatment for substance abuse?: No Suicide  prevention information given to non-admitted patients: Not applicable  Risk to Others within the past 6 months Homicidal Ideation: No Does patient have any lifetime risk of violence toward others beyond the six months prior to admission? : No Thoughts of Harm to Others: No Current Homicidal Intent: No Current Homicidal Plan: No Access to Homicidal Means: No History of harm to others?: No Assessment of Violence: None Noted Violent Behavior Description:  (none) Does patient have access to weapons?:  ("probably through friends" firearms, family has some) Criminal Charges Pending?: No Does patient have a court date: No Is patient on probation?: No  Psychosis Hallucinations: None noted Delusions: None noted  Mental Status Report Appearance/Hygiene: In scrubs Eye Contact: Poor Motor Activity: Freedom of movement Speech: Logical/coherent Level of Consciousness: Irritable Mood: Anxious Affect: Appropriate to circumstance Anxiety Level: Minimal Thought Processes: Coherent, Relevant Judgement: Partial Orientation: Person, Place, Time Obsessive Compulsive Thoughts/Behaviors: None (worries about being selfish )  Cognitive Functioning Concentration: Fair Memory: Recent Intact, Remote Intact IQ: Average Insight: Fair Impulse Control: Fair Sleep: Increased Total Hours of Sleep: 10 Vegetative Symptoms: Decreased grooming  ADLScreening [facility] Assessment Services) Patient's cognitive ability adequate to safely complete daily activities?: Yes Patient able to express need for assistance with ADLs?: Yes Independently performs ADLs?: Yes (appropriate for developmental age)  Prior Inpatient  Therapy Prior Inpatient Therapy: Yes Prior Therapy Dates: 2018 Prior Therapy Facilty/Provider(s): [facility] Reason for Treatment: MH issues  Prior Outpatient Therapy Prior Outpatient Therapy: Yes Prior Therapy Dates: Unknown Prior Therapy Facilty/Provider(s): Pt reports  "different providers" Reason for Treatment: MH issues Does patient have an ACCT team?: No Does patient have Intensive In-House Services?  : No Does patient have [facility] services? : No Does patient have P4CC services?: No  ADL Screening (condition at time of admission) Patient's cognitive ability adequate to safely complete daily activities?: Yes Is the patient deaf or have difficulty hearing?: No Does the patient have difficulty concentrating, remembering, or making decisions?: No Patient able to express need for assistance with ADLs?: Yes Does the patient have difficulty dressing or bathing?: No Independently performs ADLs?: Yes (appropriate for developmental age) Does the patient have difficulty walking or climbing stairs?: No Weakness of Legs: None Weakness of Arms/Hands: None  Home Assistive Devices/Equipment Home Assistive Devices/Equipment: None    Abuse/Neglect Assessment (Assessment to be complete while patient is alone) Physical Abuse: Denies Verbal Abuse: Denies Sexual Abuse: Denies Exploitation of patient/patient's resources: Denies Self-Neglect: Denies Values / Beliefs Cultural Requests During Hospitalization: None Spiritual Requests During Hospitalization: None   Advance Directives (For Healthcare) Does Patient Have a Medical Advance Directive?: No    Additional Information 1:1 In Past 12 Months?: No CIRT Risk: No Elopement Risk: No Does patient have medical clearance?: Yes     Disposition:  Disposition Initial Assessment Completed for this Encounter: Yes Disposition of Patient: Inpatient treatment program Type of inpatient treatment program: Adult  [facility], Wenzlick Hines 12/11/2016 1:38 PM

## 2016-12-11 NOTE — ED Notes (Signed)
Pt discharged to behavioral health hospital.. Left unit ambulatorily accompanied by [facility] male driver. Left in stable condition.

## 2016-12-11 NOTE — ED Notes (Signed)
Report given to Spero CurbBeverly, [facility]. [facility] transportation called. Receiver said that they were in shift change and that someone would be here as soon as soon as they can.

## 2016-12-11 NOTE — Progress Notes (Signed)
Admission note: Pt presents with an animated affect and an anxious mood. Pt speech is tangential during assessment. Pt was making bizarre statements. Pt responses were noted to be delayed and vague. Pt was difficult to assess. Pt stated "so if I have sex with my girl friend while I'm here, what will happen". Pt vaguely stated that his suicidal thoughts are triggered by on going family issues. Pt reported that he's been off his meds for the last two months because he was unable to afford them. Pt stated that he went to [facility] to [facility] his prescriptions but they charged him 300.00 dollars which he could not afford. Pt endorses passive SI with a plan to cut his wrist. Pt verbally contracts for safety. Pt appears preoccupied and often doesn't make sense when conveying thoughts. Pt hypertensive on admission. Report given to [facility], Charity fundraiserN., will continue to monitor B/p.

## 2016-12-11 NOTE — BH Assessment (Signed)
Per Jacki ConesLaurie, NP, IP treatment is recommended. Per Miki KinsLinsey, AC, [facility]. Support paperwork completed. Pending [facility] transport.

## 2016-12-11 NOTE — ED Notes (Signed)
Charge nurse made aware that sitter indicated

## 2016-12-11 NOTE — Progress Notes (Addendum)
Report received from admitting nurse. Patient denies SI/HI/AVH and pain at this time. Orders received and acknowledged.  Medications administered as per order. Patient verbally contracts for safety on the unit and agrees to come to staff before acting on any self harm thoughts/feelings and other needs or concerns. 15 min checks initiated for safety and patient oriented to the unit and the unit schedule. Patient's VS have been elevated at times throughout the day. Admit VS were 115/110 sitting and 160/105 standing. Retaken at 6:30 pm manually. BP 148/96 and pulse 80, while lying. Patient also had HCTZ due at 1745pm, which was held as norvasc was given in the ED at 1447 and VS are trending down.

## 2016-12-11 NOTE — Tx Team (Signed)
Initial Treatment Plan 12/11/2016 5:16 PM Don Vaughn [id number]    PATIENT STRESSORS: Financial difficulties Medication change or noncompliance   PATIENT STRENGTHS: Ability for insight Capable of independent living   PATIENT IDENTIFIED PROBLEMS: "information about outpt support groups"  "Get  Back on medications/med adjustment"                   DISCHARGE CRITERIA:  Ability to meet basic life and health needs Adequate post-discharge living arrangements  PRELIMINARY DISCHARGE PLAN: Attend aftercare/continuing care group Attend PHP/IOP  PATIENT/FAMILY INVOLVEMENT: This treatment plan has been presented to and reviewed with the patient, Don Vaughn, and/or family member.  The patient and family have been given the opportunity to ask questions and make suggestions.  Don Vaughn, [name], RN 12/11/2016, 5:16 PM
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
## 2016-12-12 DIAGNOSIS — F332 Major depressive disorder, recurrent severe without psychotic features: Principal | ICD-10-CM

## 2016-12-12 DIAGNOSIS — F1220 Cannabis dependence, uncomplicated: Secondary | ICD-10-CM

## 2016-12-12 DIAGNOSIS — F1994 Other psychoactive substance use, unspecified with psychoactive substance-induced mood disorder: Secondary | ICD-10-CM

## 2016-12-12 DIAGNOSIS — R45851 Suicidal ideations: Secondary | ICD-10-CM

## 2016-12-12 DIAGNOSIS — Z630 Problems in relationship with spouse or partner: Secondary | ICD-10-CM

## 2016-12-12 DIAGNOSIS — Z560 Unemployment, unspecified: Secondary | ICD-10-CM

## 2016-12-12 NOTE — Progress Notes (Signed)
[facility] Group Notes:  (Nursing/MHT/Case Management/Adjunct)  Date:  12/12/2016  Time:  10:25 PM  Type of Therapy:  Psychoeducational Skills  Participation Level:  Minimal  Participation Quality:  Attentive  Affect:  Flat  Cognitive:  Lacking  Insight:  Limited  Engagement in Group:  Developing/Improving  Modes of Intervention:  Education  Summary of Progress/Problems: The patient had little to share in group except to say that he had a pretty good day. In terms of the theme of the day, his wellness strategy will be to eat healthier and to exercise more.   Hazle CocaGOODMAN, Mallerie Blok S 12/12/2016, 10:25 PM

## 2016-12-12 NOTE — Plan of Care (Signed)
Problem: Education: Goal: Knowledge of [facility] Education information/materials will improve Outcome: Completed/Met Date Met: 12/12/16 Completed on admission Goal: Emotional status will improve Outcome: Not Progressing Patient depressed, would not get out of bed except for meals despite encouragement.  States "I don't feel well."  C/O headache and mild stomach cramping but declines intervention.  MD aware.  Problem: Coping: Goal: Ability to demonstrate self-control will improve Outcome: Not Applicable Date Met: 11/55/20 Has demonstrated self-control for duration of current admission.

## 2016-12-12 NOTE — Progress Notes (Signed)
Patient ID: Lewis ShockMichael Stanko, male   DOB: February 15, 1986, 31 y.o.   MRN: [id number]  Pt currently presents with a flat affect and labile behavior. Interaction with pt is superficial, upon more discussion pt reports that he "wants to work on figuring out his medications" and does not want to "take Risperdal anymore, I am already tired enough during the day." Pt then becomes upset and states "I don't like talking about my feelings." Pt reports good sleep with current medication regimen. Reports physical symptoms including a runny nose and cough, requests Dayquil.   Pt provided with medications per providers orders. Pt's labs and vitals were monitored throughout the night. Pt given a 1:1 about emotional and mental status. Pt supported and encouraged to express concerns and questions. Pt educated on medications. Provider notified of patients physical complaints, see MAR.   Pt's safety ensured with 15 minute and environmental checks. Pt currently denies SI/HI and A/V hallucinations. Pt verbally agrees to seek staff if SI/HI or A/VH occurs and to consult with staff before acting on any harmful thoughts. Pt interacts positively with peers, forwards more to staff today. Will continue POC.
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
## 2016-12-12 NOTE — Progress Notes (Signed)
Nutrition Brief Note  Patient identified on the Malnutrition Screening Tool (MST) Report  Wt Readings from Last 15 Encounters:  12/11/16 237 lb (107.5 kg)  12/11/16 230 lb (104.3 kg)  11/26/16 230 lb (104.3 kg)  11/01/16 242 lb 11.6 oz (110.1 kg)  10/21/13 231 lb (104.8 kg)    Body mass index is 35 kg/m. Patient meets criteria for obesity based on current BMI.  Labs and medications reviewed.   No nutrition interventions warranted at this time. If nutrition issues arise, please consult RD.   Tilda FrancoLindsey Jude Linck, MS, RD, LDN Pager: [phone] After Hours Pager: [phone]

## 2016-12-12 NOTE — [facility] Group Notes (Signed)
[facility] LCSW Aftercare Discharge Planning Group Note   Date/time: 12/12/2016 9:30 AM  Type of Group and Topic: Psychoeducational Group:  Discharge Planning  Participation Level:  Did Not Attend, invited  Description of Group  Discharge planning group reviews patient's anticipated discharge plans and assists patients to anticipate and address any barriers to wellness/recovery in the community.  Suicide prevention education is reviewed with patients in group.  Therapeutic Goals 1. Patients will state their anticipated discharge plan and mental health aftercare 2. Patients will identify potential barriers to wellness in the community setting 3. Patients will engage in problem solving, solution focused discussion of ways to anticipate and address barriers to wellness/recovery  Summary of Patient Progress   Plan for Discharge/Comments:    Transportation Means:   Supports:  Therapeutic Modalities: Motivational Interviewing   Don Alire, LCSW Lead Clinical Social Worker Phone:  [phone]  

## 2016-12-12 NOTE — Progress Notes (Signed)
Psychoeducational Group Note  Date:  12/12/2016 Time:  0123  Group Topic/Focus:  Wrap-Up Group:   The focus of this group is to help patients review their daily goal of treatment and discuss progress on daily workbooks.  Participation Level: Did Not Attend  Participation Quality:  Not Applicable  Affect:  Not Applicable  Cognitive:  Not Applicable  Insight:  Not Applicable  Engagement in Group: Not Applicable  Additional Comments:  The patient did not attend group last evening since he was asleep.   Don Vaughn 12/12/2016, 1:23 AM

## 2016-12-12 NOTE — [facility] Counselor (Signed)
Adult Comprehensive Assessment  Patient ID: Don Vaughn, male   DOB: 16-Sep-1985, 31 y.o.   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  Physical health (include injuries & life threatening diseases): None reported Social relationships: limited social support; poor family support  Substance abuse: meth, cocaine, thc recently. Minimizes use.  Bereavement / Loss: None reported  Living/Environment/Situation:  Living Arrangements: Other (Comment)  Living in hotel with friends.  Living conditions (as described by patient or guardian): transient How long has patient lived in current situation?: couple months  What is atmosphere in current home: Chaotic; temporary   Family History:  Marital status: Single Does patient have children?: No  Childhood History:  By whom was/is the patient raised?: Both parents Description of patient's relationship with caregiver when they were a child: "okay" Patient's description of current relationship with people who raised him/her: "it's okay"; parents are elderly and he helps care for them  Does patient have siblings?: Yes Number of Siblings: 2 Description of patient's current relationship with siblings: not a very good relationship; limited contact Did patient suffer any verbal/emotional/physical/sexual abuse as a child?: No Did patient suffer from severe childhood neglect?: No Has patient ever been sexually abused/assaulted/raped as an adolescent or adult?: No Was the patient ever a victim of a crime or a disaster?: No Witnessed domestic  violence?: No Has patient been effected by domestic violence as an adult?: No  Education:  Highest grade of school patient has completed: Some college Currently a Consulting civil engineer?: No Learning disability?: No  Employment/Work Situation:  Employment situation: Employed 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 How long has patient been employed?: few months Patient's job has been impacted by current illness: No What is the longest time patient has a held a job?: n/a Where was the patient employed at that time?: n/a Has patient ever been in the [employer]?: No Has patient ever served in combat?: No Did You Receive Any Psychiatric Treatment/Services While in Equities trader?: No Are There Guns or Other Weapons in Your Home?: No  Financial Resources:  Financial resources: Income from employment Does patient have a representative payee or guardian?: No  Alcohol/Substance Abuse:  What has been your use of drugs/alcohol within the last 12 months?: recent use of meth, cocaine, and THC. Diagnosed with substance induced mood disorder.  If attempted suicide, did drugs/alcohol play a role in this?: Yes, attempted to cut wrists while high prior to this admission.  Alcohol/Substance Abuse Treatment Hx: Past detox at [facility] 10/2016 Has alcohol/substance abuse ever caused legal problems?: No-however pt reports current legal issues are a major source of stress for him. Declines to discuss what legal issues he has specifically.   Social Support System: Conservation officer, nature Support System: Poor Describe Community Support System: limited support Type of faith/religion: none How does patient's faith help to cope with current illness?: n/a  Leisure/Recreation:  Leisure and Hobbies: nothing anymore, used to like music, Publishing rights manager   Strengths/Needs:  What things does the patient do well?: "I guess but I don't know" In what areas does patient struggle / problems for patient: "a lot of  things"  Discharge Plan:  Does patient have access  to transportation?: No Plan for no access to transportation at discharge: city bus Will patient be returning to same living situation after discharge?: unsure-pt declining to engage in aftercare planning at this time. ; hotel  Plan for living situation after discharge: [employer], boarding Currently receiving community mental health services: No If no, would patient like referral for services when discharged?: Yes (What county?) Medical sales representative([place]) Does patient have financial barriers related to discharge medications?: Yes Patient description of barriers related to discharge medications: limited income; no insurance  Summary/Recommendations:   Summary and Recommendations (to be completed by the evaluator): Patient is 31yo male living in [place], [place] He presents to the hospital after attempting to cut wrist in suicide attempt. He has a primary diagnosis of Subtance Induced Mood Disorder. Patient has been abusing: methamphetamines, cocaine, and THC. He lives in hotel with roomates who wrestled weapoin away from him when he was attempting to harm himself. Pt's last admission was 11/01/16 for similar issues. Patient reports that he has legal issues in addition to financial strain that has contributed to thoughts of hopelessness and SI. Limited family supports; isolative at times. Recommendations for patient include: crisis stabilization, therapeutic milieu, encourage group attendance and participation, medication management for detox/mood stabilization, and development of comprehensive mental wellness/sobriety plan. CSW assessing for appropriate referrals.   Alva Kuenzel [employer] Smart. 12/12/2016 3:22 PM

## 2016-12-12 NOTE — Progress Notes (Signed)
Nursing Note 12/12/2016 [id number]  Data Patient refused to complete self-inventory this AM.  Refused to come to desk this AM after multiple prompts.  Refused to get out of bed when fire alarm went off stating "I came in for suicide so what does it matter."  Patient continued to be encouraged  Interim director called into room- just as interim director got ready to call security for assistance the fire alarm stopped and "all clear" was called.  Denied HI, SI, AVH, agrees to come to staff before acting on harmful thoughts.  Patient stayed in bed all day except for rec time and meals- despite encouragement and multiple prompts.  MD had to call patient several times before patient would go into MD's office.   Action Spoke with patient 1:1, nurse offered support to patient throughout shift.  MD aware of patient's bx.  Continues to be monitored on 15 minute checks for safety.  Response Patient remains safe after fire alarm this AM; though, does not participate.   MD aware.

## 2016-12-12 NOTE — Tx Team (Signed)
Interdisciplinary Treatment and Diagnostic Plan Update  12/12/2016 Time of Session: 0830AM Don Vaughn MRN: [id number]  Principal Diagnosis: MDD, recurrent, severe, without psychosis  Secondary Diagnoses: Active Problems:   MDD (major depressive disorder), recurrent severe, without psychosis ([place])   Current Medications:  Current Facility-Administered Medications  Medication Dose Route Frequency Provider Last Rate Last Dose  . acetaminophen (TYLENOL) tablet 650 mg  650 mg Oral Q6H PRN Ethelene Hal, NP      . alum & mag hydroxide-simeth (MAALOX/MYLANTA) 200-200-20 MG/5ML suspension 30 mL  30 mL Oral Q4H PRN Ethelene Hal, NP      . feeding supplement (ENSURE ENLIVE) (ENSURE ENLIVE) liquid 237 mL  237 mL Oral BID BM Cobos, Myer Peer, MD      . hydrochlorothiazide (HYDRODIURIL) tablet 25 mg  25 mg Oral Daily Ethelene Hal, NP      . hydrOXYzine (ATARAX/VISTARIL) tablet 25 mg  25 mg Oral Q6H PRN Ethelene Hal, NP      . magnesium hydroxide (MILK OF MAGNESIA) suspension 30 mL  30 mL Oral Daily PRN Ethelene Hal, NP      . pneumococcal 23 valent vaccine (PNU-IMMUNE) injection 0.5 mL  0.5 mL Intramuscular Tomorrow-1000 Ethelene Hal, NP      . traZODone (DESYREL) tablet 50 mg  50 mg Oral QHS PRN Ethelene Hal, NP       PTA Medications: Prescriptions Prior to Admission  Medication Sig Dispense Refill Last Dose  . amLODipine (NORVASC) 10 MG tablet Take 10 mg by mouth daily.   Past Month at Unknown time  . amLODipine (NORVASC) 5 MG tablet Take 1 tablet (5 mg total) by mouth daily. For elevated blood pressure (Patient not taking: Reported on 12/11/2016) 30 tablet 0 Not Taking at Unknown time  . buPROPion (WELLBUTRIN XL) 150 MG 24 hr tablet Take 1 tablet (150 mg total) by mouth daily. For depression (Patient not taking: Reported on 12/11/2016) 30 tablet 0 Not Taking at Unknown time  . buPROPion (WELLBUTRIN XL) 300 MG 24 hr tablet Take 300 mg by  mouth daily.   Past Month at Unknown time  . cloNIDine (CATAPRES) 0.1 MG tablet Take 1 tablet (0.1 mg total) by mouth 2 (two) times daily. For high blood pressure 30 tablet 0 Past Month at Unknown time  . gabapentin (NEURONTIN) 300 MG capsule Take 300 mg by mouth 3 (three) times daily.   Past Month at Unknown time  . hydrochlorothiazide (HYDRODIURIL) 25 MG tablet Take 25 mg by mouth daily.   Past Month at Unknown time  . hydrOXYzine (ATARAX/VISTARIL) 25 MG tablet Take 1 tablet (25 mg total) by mouth 3 (three) times daily as needed for anxiety. 60 tablet 0 Past Month at Unknown time  . traZODone (DESYREL) 150 MG tablet Take 1 tablet (150 mg total) by mouth at bedtime. For sleep (Patient not taking: Reported on 12/11/2016) 30 tablet 0 Not Taking at Unknown time  . ziprasidone (GEODON) 40 MG capsule Take 40 mg by mouth at bedtime.   Past Month at Unknown time    Patient Stressors: Financial difficulties Medication change or noncompliance  Patient Strengths: Ability for insight Capable of independent living  Treatment Modalities: Medication Management, Group therapy, Case management,  1 to 1 session with clinician, Psychoeducation, Recreational therapy.   Physician Treatment Plan for Primary Diagnosis: MDD, recurrent, severe, without psychosis  Medication Management: Evaluate patient's response, side effects, and tolerance of medication regimen.  Therapeutic Interventions: 1 to 1 sessions, Unit  Group sessions and Medication administration.  Evaluation of Outcomes: Not Met  Physician Treatment Plan for Secondary Diagnosis: Active Problems:   MDD (major depressive disorder), recurrent severe, without psychosis ([place])   Medication Management: Evaluate patient's response, side effects, and tolerance of medication regimen.  Therapeutic Interventions: 1 to 1 sessions, Unit Group sessions and Medication administration.  Evaluation of Outcomes: Not Met   RN Treatment Plan for Primary  Diagnosis: MDD, recurrent, severe, without psychosis Long Term Goal(s): Knowledge of disease and therapeutic regimen to maintain health will improve  Short Term Goals: Ability to verbalize frustration and anger appropriately will improve, Ability to participate in decision making will improve and Ability to disclose and discuss suicidal ideas  Medication Management: RN will administer medications as ordered by provider, will assess and evaluate patient's response and provide education to patient for prescribed medication. RN will report any adverse and/or side effects to prescribing provider.  Therapeutic Interventions: 1 on 1 counseling sessions, Psychoeducation, Medication administration, Evaluate responses to treatment, Monitor vital signs and CBGs as ordered, Perform/monitor CIWA, COWS, AIMS and Fall Risk screenings as ordered, Perform wound care treatments as ordered.  Evaluation of Outcomes: Not Met   LCSW Treatment Plan for Primary Diagnosis: MDD, recurrent, severe, without psychosis Long Term Goal(s): Safe transition to appropriate next level of care at discharge, Engage patient in therapeutic group addressing interpersonal concerns.  Short Term Goals: Engage patient in aftercare planning with referrals and resources, Facilitate patient progression through stages of change regarding substance use diagnoses and concerns and Identify triggers associated with mental health/substance abuse issues  Therapeutic Interventions: Assess for all discharge needs, 1 to 1 time with Social worker, Explore available resources and support systems, Assess for adequacy in community support network, Educate family and significant other(s) on suicide prevention, Complete Psychosocial Assessment, Interpersonal group therapy.  Evaluation of Outcomes: Not Met   Progress in Treatment: Attending groups: No. Participating in groups: No. Taking medication as prescribed: Yes. Toleration medication:  Yes. Family/Significant other contact made: No, will contact:  family member if patient consents Patient understands diagnosis: No. poor insight at this time.  Discussing patient identified problems/goals with staff: Yes. Medical problems stabilized or resolved: Yes. Denies suicidal/homicidal ideation: No. Issues/concerns per patient self-inventory: No. Other: n/a   New problem(s) identified: Yes, Describe:  pt remains isolative in room; declining to engage in programming or aftercare planning  New Short Term/Long Term Goal(s): detox,engagement in treatment and aftercare planing; medication management for mood stabilization; development of comprehensive mental wellness/sobriety plan.   Discharge Plan or Barriers: CSW assessing for appropriate referrals.   Reason for Continuation of Hospitalization: Anxiety Depression Medication stabilization Suicidal ideation Withdrawal symptoms  Estimated Length of Stay: Thursday, 12/15/16  Attendees: Patient: 12/12/2016 8:21 AM  Physician: Dr. Sanjuana Letters MD 12/12/2016 8:21 AM  Nursing: Bertell Maria RN 12/12/2016 8:21 AM  RN Care Manager: Lars Pinks CM 12/12/2016 8:21 AM  Social Worker: Press photographer, LCSW 12/12/2016 8:21 AM  Recreational Therapist: x 12/12/2016 8:21 AM  Other: Lindell Spar NP; Ricky Ala NP; Darnelle Maffucci Money NP 12/12/2016 8:21 AM  Other:  12/12/2016 8:21 AM  Other: 12/12/2016 8:21 AM    Scribe for Treatment Team: Kimber Relic Smart, LCSW 12/12/2016 8:21 AM

## 2016-12-12 NOTE — Progress Notes (Signed)
Recreation Therapy Notes  Date: 12/12/2016 Time: 9:30am Location: [address] Dayroom  Group Topic: Stress Management  Goal Area(s) Addresses:  Patient will verbalize importance of using healthy stress management.  Patient will identify positive emotions associated with healthy stress management.   Intervention: Stress Management  Activity :  Guided Body Scan. Recreation Therapy Intern introduced the stress management technique of guided body scans. Recreation Therapy Intern played a YouTube video that allowed patients to focus on the tension built up in each part of their body. Patients were to follow along as script was read to engage in the activity.  Education: Stress Management, Discharge Planning.   Education Outcome: Acknowledges edcuation  Clinical Observations/Feedback: Pt did not attend group.  Rachel Meyer, Recreation Therapy Intern    Jaiquan Temme, LRT/CTRS   

## 2016-12-12 NOTE — [facility] Suicide Risk Assessment (Signed)
[facility]   Nursing information obtained from:  Patient Demographic factors:  Male, Low socioeconomic status Current Mental Status:  Suicidal ideation indicated by patient, Suicide plan, Self-harm thoughts, Self-harm behaviors Loss Factors:  Financial problems / change in socioeconomic status Historical Factors:  Impulsivity Risk Reduction Factors:  Positive social support, Positive therapeutic relationship  Total Time spent with patient: 30 minutes Principal Problem: Substance induced mood disorder (HCC) Diagnosis:   Patient Active Problem List   Diagnosis Date Noted  . Cannabis use disorder, moderate, dependence (HCC) [F12.20] 12/11/2016  . MDD (major depressive disorder), recurrent severe, without psychosis ([facility]) [F33.2] 12/11/2016  . Cocaine abuse [F14.10] 12/02/2016  . Polysubstance abuse [F19.10] 11/27/2016  . Substance induced mood disorder (HCC) [F19.94] 11/27/2016  . Severe episode of recurrent major depressive disorder, without psychotic features ([facility]) [F33.2]   . Major depressive disorder, single episode, severe without psychotic features ([facility]) [F32.2] 11/02/2016  . Stimulant use disorder [F15.90] 11/01/2016   Subjective Data:  31 yo AAM, single, lives with a room mate. Recently discharged from our unit and [facility]. Presented to to the ER a week ago for suicidal thoughts. At that time expressed thoughts to shoot self with a gun.  This admission was on account of suicidal behavior. He had attempted to slit his wrist. His roommate wrestled the weapon off his hands. He had expressed being stressed by legal issues, bing unemployed and on going difficulties in his relationship. UDS was positive for cocaine amphetamine and THC. Potassium is 3.4. Other labs are essentially normal. Patient has a weapon at home. He has been in and out of care environment over three times in the past month. He is actively using substances. He is very impulsive. He is not  fully engaging with care at this time. He has agreed to use of Risperidone.  Continued Clinical Symptoms:  Alcohol Use Disorder Identification Test Final Score (AUDIT): 0 The "Alcohol Use Disorders Identification Test", Guidelines for Use in Primary Care, Second Edition.  World Science writerHealth Organization [facility](WHO). Score between 0-7:  no or low risk or alcohol related problems. Score between 8-15:  moderate risk of alcohol related problems. Score between 16-19:  high risk of alcohol related problems. Score 20 or above:  warrants further diagnostic evaluation for alcohol dependence and treatment.   CLINICAL FACTORS:   SUD   Musculoskeletal: Strength & Muscle Tone: As in H&P Gait & Station: As in H&P Patient leans: As in H&P  Psychiatric Specialty Exam: Physical Exam  ROS  Blood pressure (!) 138/100, pulse 76, temperature 99 F (37.2 C), temperature source Oral, resp. rate 16, height 5\' 9"  (1.753 m), weight 107.5 kg (237 lb), SpO2 98 %.Body mass index is 35 kg/m.  General Appearance: As in H&P  Eye Contact:  As in H&P  Speech:  As in H&P  Volume:  As in H&P  Mood:  As in H&P  Affect:  As in H&P  Thought Process:  As in H&P  Orientation:  As in H&P  Thought Content:  As in H&P  Suicidal Thoughts:  As in H&P  Homicidal Thoughts:  As in H&P  Memory:  As in H&P  Judgement:  As in H&P  Insight:  As in H&P  Psychomotor Activity:  As in H&P  Concentration:  As in H&P  Recall:  As in H&P  Fund of Knowledge:  As in H&P  Language:  As in H&P  Akathisia:  As in H&P  Handed:  As in  H&P  AIMS (if indicated):     Assets:  As in H&P  ADL's:  As in H&P  Cognition:  As in H&P  Sleep:  Number of Hours: 6      COGNITIVE FEATURES THAT CONTRIBUTE TO RISK:  Loss of executive function    SUICIDE RISK:   Severe:  Frequent, intense, and enduring suicidal ideation, specific plan, no subjective intent, but some objective markers of intent (i.e., choice of lethal method), the method is accessible,  some limited preparatory behavior, evidence of impaired self-control, severe dysphoria/symptomatology, multiple risk factors present, and few if any protective factors, particularly a lack of social support.  PLAN OF CARE:  As in H&P  I certify that inpatient services furnished can reasonably be expected to improve the patient's condition.   Georgiann Cocker, MD 12/12/2016, 2:51 PM

## 2016-12-12 NOTE — [facility] Group Notes (Signed)
[facility] LCSW Group Therapy  12/12/2016 3:10 PM  Type of Therapy:  Group Therapy  Participation Level:  Did Not Attend-pt invited. Chose to remain in bed.   Summary of Progress/Problems: Today's Topic: Overcoming Obstacles. Patients identified one short term goal and potential obstacles in reaching this goal. Patients processed barriers involved in overcoming these obstacles. Patients identified steps necessary for overcoming these obstacles and explored motivation (internal and external) for facing these difficulties head on.   Lumina Gitto N Smart LCSW 12/12/2016, 3:10 PM

## 2016-12-13 DIAGNOSIS — G4700 Insomnia, unspecified: Secondary | ICD-10-CM

## 2016-12-13 DIAGNOSIS — F17210 Nicotine dependence, cigarettes, uncomplicated: Secondary | ICD-10-CM

## 2016-12-13 MED ORDER — LORATADINE 10 MG PO TABS
10.0000 mg | ORAL_TABLET | Freq: Every day | ORAL | Status: DC
  Filled 2016-12-13 (×6): qty 1

## 2016-12-13 NOTE — Progress Notes (Signed)
Patient ID: Don Vaughn, male   DOB: 01/12/1986, 31 y.o.   MRN: [id number]  Pt currently presents with a flat affect and guarded behavior. Pt states "I was really depressed today, I didn't want to talk to anyone." Pt reports good sleep, does not wish to take any medications tonight. Pt seen interacting positively with peers on the unit tonight.   Pt's labs and vitals were monitored throughout the night. Pt given a 1:1 about emotional and mental status. Pt supported and encouraged to express concerns and questions. Pt educated on suicide prevention precautions.   Pt's safety ensured with 15 minute and environmental checks. Pt currently denies SI/HI and A/V hallucinations. Pt verbally agrees to seek staff if SI/HI or A/VH occurs and to consult with staff before acting on any harmful thoughts. Will continue POC.

## 2016-12-13 NOTE — Plan of Care (Signed)
Problem: Activity: Goal: Interest or engagement in leisure activities will improve Outcome: Not Progressing Patient has been laying in bed throughout the day. He does not appear interested in leisure activities other than sleeping at this time. He is not engaged.

## 2016-12-13 NOTE — Progress Notes (Signed)
Adult Psychoeducational Group Note  Date:  12/13/2016 Time:  10:15 PM  Group Topic/Focus:  Wrap-Up Group:   The focus of this group is to help patients review their daily goal of treatment and discuss progress on daily workbooks.  Participation Level:  Active  Participation Quality:  Appropriate  Affect:  Appropriate  Cognitive:  Alert  Insight: Appropriate  Engagement in Group:  Engaged  Modes of Intervention:  Discussion  Additional Comments:  Pt stated that today was a good day overall. His goal is to make it to breakfast in the morning.   Kaleen OdeaCOOKE, Saharra Santo R 12/13/2016, 10:15 PM

## 2016-12-13 NOTE — Progress Notes (Signed)
Patient ID: Don ShockMichael Bigler, male   DOB: [birth year], 30 y.o.   MRN: [id number]  Patient currently resting in bed with eyes closed. He is snoring at this time as well. Q15 minute safety checks are maintained.

## 2016-12-13 NOTE — [facility] Suicide Risk Assessment (Signed)
[facility] INPATIENT:  Family/Significant Other Suicide Prevention Education  Suicide Prevention Education:  Patient Refusal for Family/Significant Other Suicide Prevention Education: The patient Don Vaughn has refused to provide written consent for family/significant other to be provided Family/Significant Other Suicide Prevention Education during admission and/or prior to discharge.  Physician notified.  SPE completed with pt, as pt refused to consent to family contact. SPI pamphlet provided to pt and pt was encouraged to share information with support network, ask questions, and talk about any concerns relating to SPE. Pt denies access to guns/firearms and verbalized understanding of information provided. Mobile Crisis information also provided to pt.   Tea Collums N Smart LCSW 12/13/2016, 10:06 AM

## 2016-12-13 NOTE — Plan of Care (Signed)
Problem: Coping: Goal: Ability to verbalize feelings will improve Outcome: Progressing Pt verbalizes feelings of effects of medications. Reports this is difficult for him to do. 12/13/16

## 2016-12-13 NOTE — [facility] Group Notes (Signed)
[facility] LCSW Group Therapy  12/13/2016 2:00 PM  Type of Therapy:  Group Therapy  Participation Level:  Did Not Attend-pt invited. Chose to remain in bed.   Summary of Progress/Problems: MHA Speaker came to talk about his personal journey with substance abuse and addiction. The pt processed ways by which to relate to the speaker. MHA speaker provided handouts and educational information pertaining to groups and services offered by the [facility].   Nurah Petrides N Smart LCSW 12/13/2016, 2:00 PM

## 2016-12-13 NOTE — Progress Notes (Signed)
[facility] MD Progress Note  12/13/2016 10:12 AM Don Vaughn  MRN:  [id number] Subjective: "I feel so much better. My meds are working really well."   Objective: Pt seen and chart reviewed. Pt is alert/oriented x4, calm, cooperative, and appropriate to situation. Pt denies suicidal/homicidal ideation and psychosis and does not appear to be responding to internal stimuli. Pt reports that he would like to remain sober and feels that this is helping greatly in terms of med management. Pt reports that he is not having any self-harm thoughts although he appears to be minimizing the severity of his symptoms overall.   Principal Problem: Substance induced mood disorder ([place]) Diagnosis:   Patient Active Problem List   Diagnosis Date Noted  . Cannabis use disorder, moderate, dependence ([place]) [F12.20] 12/11/2016  . MDD (major depressive disorder), recurrent severe, without psychosis ([place]) [F33.2] 12/11/2016  . Cocaine abuse [F14.10] 12/02/2016  . Polysubstance abuse [F19.10] 11/27/2016  . Substance induced mood disorder ([place]) [F19.94] 11/27/2016  . Severe episode of recurrent major depressive disorder, without psychotic features ([place]) [F33.2]   . Major depressive disorder, single episode, severe without psychotic features ([place]) [F32.2] 11/02/2016  . Stimulant use disorder [F15.90] 11/01/2016   Total Time spent with patient: 30 minutes  Past Psychiatric History:see H&P  Past Medical History:  Past Medical History:  Diagnosis Date  . Depression   . Hypertension     Past Surgical History:  Procedure Laterality Date  . EXTERNAL EAR SURGERY Right    "it was cutt off and sutured back on"   Family History:  Family History  Problem Relation Age of Onset  . Mental illness Neg Hx    Family Psychiatric  History: see H&P Social History:  History  Alcohol Use No     History  Drug Use  . Types: Marijuana    Social History   Social History  . Marital status: Married    Spouse name: N/A  .  Number of children: N/A  . Years of education: N/A   Social History Main Topics  . Smoking status: Current Every Day Smoker  . Smokeless tobacco: Never Used  . Alcohol use No  . Drug use: Yes    Types: Marijuana  . Sexual activity: Not Currently   Other Topics Concern  . None   Social History Narrative  . None   Additional Social History:                         Sleep: Fair  Appetite:  Fair  Current Medications: Current Facility-Administered Medications  Medication Dose Route Frequency Provider Last Rate Last Dose  . acetaminophen (TYLENOL) tablet 650 mg  650 mg Oral Q6H PRN Ethelene Hal, NP      . alum & mag hydroxide-simeth (MAALOX/MYLANTA) 200-200-20 MG/5ML suspension 30 mL  30 mL Oral Q4H PRN Ethelene Hal, NP      . hydrochlorothiazide (HYDRODIURIL) tablet 25 mg  25 mg Oral Daily Ethelene Hal, NP   25 mg at 12/12/16 1217  . hydrOXYzine (ATARAX/VISTARIL) tablet 25 mg  25 mg Oral Q6H PRN Ethelene Hal, NP   25 mg at 12/12/16 2135  . loratadine (CLARITIN) tablet 10 mg  10 mg Oral Daily Simon, Spencer E, PA-C      . magnesium hydroxide (MILK OF MAGNESIA) suspension 30 mL  30 mL Oral Daily PRN Ethelene Hal, NP      . pneumococcal 23 valent vaccine (PNU-IMMUNE) injection 0.5  mL  0.5 mL Intramuscular Tomorrow-1000 Ethelene Hal, NP      . traZODone (DESYREL) tablet 50 mg  50 mg Oral QHS PRN Ethelene Hal, NP   50 mg at 12/12/16 2135    Lab Results:  Results for orders placed or performed during the hospital encounter of 12/11/16 (from the past 48 hour(s))  Urine rapid drug screen (hosp performed)     Status: Abnormal   Collection Time: 12/11/16 12:20 PM  Result Value Ref Range   Opiates NONE DETECTED NONE DETECTED   Cocaine POSITIVE (A) NONE DETECTED   Benzodiazepines NONE DETECTED NONE DETECTED   Amphetamines POSITIVE (A) NONE DETECTED   Tetrahydrocannabinol POSITIVE (A) NONE DETECTED   Barbiturates  NONE DETECTED NONE DETECTED    Comment:        DRUG SCREEN FOR MEDICAL PURPOSES ONLY.  IF CONFIRMATION IS NEEDED FOR ANY PURPOSE, NOTIFY LAB WITHIN 5 DAYS.        LOWEST DETECTABLE LIMITS FOR URINE DRUG SCREEN Drug Class       Cutoff (ng/mL) Amphetamine      1000 Barbiturate      200 Benzodiazepine   470 Tricyclics       962 Opiates          300 Cocaine          300 THC              50   Basic metabolic panel     Status: Abnormal   Collection Time: 12/11/16 12:22 PM  Result Value Ref Range   Sodium 140 135 - 145 mmol/L   Potassium 3.4 (L) 3.5 - 5.1 mmol/L   Chloride 105 101 - 111 mmol/L   CO2 25 22 - 32 mmol/L   Glucose, Bld 127 (H) 65 - 99 mg/dL   BUN 22 (H) 6 - 20 mg/dL   Creatinine, Ser 1.26 (H) 0.61 - 1.24 mg/dL   Calcium 9.2 8.9 - 10.3 mg/dL   GFR calc non Af Amer >60 >60 mL/min   GFR calc Af Amer >60 >60 mL/min    Comment: (NOTE) The eGFR has been calculated using the CKD EPI equation. This calculation has not been validated in all clinical situations. eGFR's persistently <60 mL/min signify possible Chronic Kidney Disease.    Anion gap 10 5 - 15  CBC with Differential     Status: None   Collection Time: 12/11/16 12:22 PM  Result Value Ref Range   WBC 4.9 4.0 - 10.5 K/uL   RBC 4.99 4.22 - 5.81 MIL/uL   Hemoglobin 14.5 13.0 - 17.0 g/dL   HCT 41.3 39.0 - 52.0 %   MCV 82.8 78.0 - 100.0 fL   MCH 29.1 26.0 - 34.0 pg   MCHC 35.1 30.0 - 36.0 g/dL   RDW 14.9 11.5 - 15.5 %   Platelets 230 150 - 400 K/uL   Neutrophils Relative % 63 %   Neutro Abs 3.1 1.7 - 7.7 K/uL   Lymphocytes Relative 27 %   Lymphs Abs 1.3 0.7 - 4.0 K/uL   Monocytes Relative 6 %   Monocytes Absolute 0.3 0.1 - 1.0 K/uL   Eosinophils Relative 3 %   Eosinophils Absolute 0.1 0.0 - 0.7 K/uL   Basophils Relative 1 %   Basophils Absolute 0.0 0.0 - 0.1 K/uL  Ethanol     Status: None   Collection Time: 12/11/16 12:23 PM  Result Value Ref Range   Alcohol, Ethyl (B) <5 <5 mg/dL  Comment:         LOWEST DETECTABLE LIMIT FOR SERUM ALCOHOL IS 5 mg/dL FOR MEDICAL PURPOSES ONLY     Blood Alcohol level:  Lab Results  Component Value Date   ETH <5 12/11/2016   ETH <5 30/16/0109    Metabolic Disorder Labs: No results found for: HGBA1C, MPG No results found for: PROLACTIN No results found for: CHOL, TRIG, HDL, CHOLHDL, VLDL, LDLCALC  Physical Findings: AIMS: Facial and Oral Movements Muscles of Facial Expression: None, normal Lips and Perioral Area: None, normal Jaw: None, normal Tongue: None, normal,Extremity Movements Upper (arms, wrists, hands, fingers): None, normal Lower (legs, knees, ankles, toes): None, normal, Trunk Movements Neck, shoulders, hips: None, normal, Overall Severity Severity of abnormal movements (highest score from questions above): None, normal Incapacitation due to abnormal movements: None, normal Patient's awareness of abnormal movements (rate only patient's report): No Awareness, Dental Status Current problems with teeth and/or dentures?: No Does patient usually wear dentures?: No  CIWA:    COWS:     Musculoskeletal: Strength & Muscle Tone: within normal limits Gait & Station: normal Patient leans: N/A  Psychiatric Specialty Exam: Physical Exam  Review of Systems  Psychiatric/Behavioral: Positive for depression and substance abuse. Negative for hallucinations and suicidal ideas. The patient is nervous/anxious and has insomnia.   All other systems reviewed and are negative.   Blood pressure (!) 158/106, pulse 69, temperature 97.7 F (36.5 C), temperature source Oral, resp. rate 18, height '5\' 9"'$  (1.753 m), weight 107.5 kg (237 lb), SpO2 98 %.Body mass index is 35 kg/m.  General Appearance: Casual and Fairly Groomed  Eye Contact:  Fair  Speech:  Clear and Coherent and Normal Rate  Volume:  Normal  Mood:  Anxious and Depressed  Affect:  Appropriate, Congruent   Thought Process:  Coherent, Goal Directed, Linear and Descriptions of  Associations: Intact  Orientation:  Full (Time, Place, and Person)  Thought Content:  Focused on treatment options  Suicidal Thoughts:  No  Homicidal Thoughts:  No  Memory:  Immediate;   Fair Recent;   Fair Remote;   Fair  Judgement:  Fair  Insight:  Fair  Psychomotor Activity:  Normal  Concentration:  Concentration: Fair and Attention Span: Fair  Recall:  [employer] of Knowledge:  Fair  Language:  Fair  Akathisia:  No  Handed:    AIMS (if indicated):     Assets:  Communication Skills Resilience Social Support  ADL's:  Intact  Cognition:  WNL  Sleep:  Number of Hours: 5.75     Treatment Plan Summary: Substance induced mood disorder ([place]) Uncontrolled, yet improving, managed as below:  Medications: -Continue trazodone '50mg'$  po qhs prn insomnia -Continue vistaril '25mg'$  po q6h prn anxiety *Pt not receptive to SSRI at this time and is minimizing symptoms   Benjamine Mola, FNP 12/13/2016, 10:12 AM

## 2016-12-13 NOTE — Progress Notes (Signed)
Patient ID: Don ShockMichael Whittington, male   DOB: 03-Dec-1985, 31 y.o.   MRN: [id number]  Patient is refusing medications, attending groups, filling out daily inventory sheet and is not allowing staff to obtain his vital signs. Interdisciplinary team notified of patient's current non-compliance and resistance to care.

## 2016-12-13 NOTE — [facility] Group Notes (Signed)
[facility] Group Notes:  Recovery  Date:  12/13/2016  Time:  10:14 AM  Type of Therapy:  Psychoeducational Skills  Participation Level:  Did Not Attend  Participation Quality:  N/A  Affect:  N/A  Cognitive:  N/A  Insight:  None  Engagement in Group:  None  Modes of Intervention:  Discussion and Education  Summary of Progress/Problems: Patient was invited to group however he did not attend.   

## 2016-12-13 NOTE — Progress Notes (Signed)
Patient ID: Don Vaughn, male   DOB: [birth year], 31 y.o.   MRN: [id number]  DAR: Pt. Denies SI/HI and A/V Hallucinations. Patient does not report any pain or discomfort at this time. He refused to fill out his daily inventory sheet. Interdisciplinary team notified of patient's unwillingness to participate. Support and encouragement provided to the patient however patient is not receptive at this time. Patient refused his medications that were scheduled. Patient is not engaged in the milieu, seen laying in bed with his eyes closed. Q15 minute checks are maintained for safety.

## 2016-12-14 ENCOUNTER — Encounter (HOSPITAL_COMMUNITY): Payer: Self-pay | Admitting: Behavioral Health

## 2016-12-14 DIAGNOSIS — F419 Anxiety disorder, unspecified: Secondary | ICD-10-CM

## 2016-12-14 DIAGNOSIS — E079 Disorder of thyroid, unspecified: Secondary | ICD-10-CM

## 2016-12-14 DIAGNOSIS — F1410 Cocaine abuse, uncomplicated: Secondary | ICD-10-CM

## 2016-12-14 NOTE — Progress Notes (Signed)
  DATA ACTION RESPONSE  Objective- Pt. is visible in the dayroom, seen watchign TV and interacting with peers .Presents with a blunted affect and mood. Appears guarded and minimal with interaction. No further c/o. No abnormal s/s.  Subjective- Denies having any SI/HI/AVH/Pain at this time. Pt. states "When can I go home?" Is cooperative and remain safe on the unit.  1:1 interaction in private to establish rapport. Encouragement, education, & support given from staff.     Safety maintained with Q 15 checks. Continue with POC.   '

## 2016-12-14 NOTE — Progress Notes (Signed)
Pt attended NA group this evening.  

## 2016-12-14 NOTE — [facility] Group Notes (Signed)
[facility] LCSW Group Therapy  12/14/2016 2:35 PM  Type of Therapy:  Group Therapy  Participation Level:  Did Not Attend  Modes of Intervention:  Activity, Discussion, Education, Socialization and Support  Summary of Progress/Problems: Emotional Regulation: Patients will identify both negative and positive emotions. They will discuss emotions they have difficulty regulating and how they impact their lives. Patients will be asked to identify healthy coping skills to combat unhealthy reactions to negative emotions.     Samel Bruna L Carthel Castille MSW, LCSW  12/14/2016, 2:35 PM   

## 2016-12-14 NOTE — Progress Notes (Signed)
[facility]  12/14/2016 11:04 AM Don ShockMichael Vaughn  MRN:  [id number]   Subjective: "I am not doing good. I am tired and I don't want to talk about it.""   Objective: Face to face evaluation completed, case discussed with treatment team, chart reviewed. Don NeedleMichael is a 31 year old male who [facility]. Patient has an extensive psychiatric background that includes multiple psychiatric admission and SUD. He denies withdrawal symptoms at this time.   During this evaluation, patient is alert/oriented x4 and calm, cooperative, and appropriate to situation. He presents with a depressed mood,  flat affect and appears very guarded. He is noted in bed with his eyes closed and he remained this was during the evaluation. He endorses that he is not having a good day and is going through things however, he declines to elaborate. As noted above, he states, : I do not want to talk about it."  Pt denies suicidal/homicidal ideas and psychosis and does not appear to be responding to internal stimuli. He rates depression, anxiety and feelings of hoplessness as 6/10 with 0 being none and 10 the worst. He endorses these symptoms are without improvement. He reports current medications are tolerated well and denies medication related side effects. Enodrses fair appetite and sleeping pattern. At this time, he is able to contract for safety on the unit.     Principal Problem: Substance induced mood disorder ([facility]) Diagnosis:   Patient Active Problem List   Diagnosis Date Noted  . Cannabis use disorder, moderate, dependence (HCC) [F12.20] 12/11/2016  . MDD (major depressive disorder), recurrent severe, without psychosis (HCC) [F33.2] 12/11/2016  . Cocaine abuse [F14.10] 12/02/2016  . Polysubstance abuse [F19.10] 11/27/2016  . Substance induced mood disorder (HCC) [F19.94] 11/27/2016  . Severe episode of recurrent major depressive disorder, without psychotic features (HCC) [F33.2]   . Major depressive  disorder, single episode, severe without psychotic features (HCC) [F32.2] 11/02/2016  . Stimulant use disorder [F15.90] 11/01/2016   Total Time spent with patient: 30 minutes  Past Psychiatric History:see H&P  Past Medical History:  Past Medical History:  Diagnosis Date  . Depression   . Hypertension     Past Surgical History:  Procedure Laterality Date  . EXTERNAL EAR SURGERY Right    "it was cutt off and sutured back on"   Family History:  Family History  Problem Relation Age of Onset  . Mental illness Neg Hx    Family Psychiatric  History: see H&P Social History:  History  Alcohol Use No     History  Drug Use  . Types: Marijuana    Social History   Social History  . Marital status: Married    Spouse name: N/A  . Number of children: N/A  . Years of education: N/A   Social History Main Topics  . Smoking status: Current Every Day Smoker  . Smokeless tobacco: Never Used  . Alcohol use No  . Drug use: Yes    Types: Marijuana  . Sexual activity: Not Currently   Other Topics Concern  . None   Social History Narrative  . None   Additional Social History:                         Sleep: Fair  Appetite:  Fair  Current Medications: Current Facility-Administered Medications  Medication Dose Route Frequency Provider Last Rate Last Dose  . acetaminophen (TYLENOL) tablet 650 mg  650 mg Oral Q6H  PRN Laveda Abbe, NP      . alum & mag hydroxide-simeth (MAALOX/MYLANTA) 200-200-20 MG/5ML suspension 30 mL  30 mL Oral Q4H PRN Laveda Abbe, NP      . hydrochlorothiazide (HYDRODIURIL) tablet 25 mg  25 mg Oral Daily Laveda Abbe, NP   25 mg at 12/12/16 1217  . hydrOXYzine (ATARAX/VISTARIL) tablet 25 mg  25 mg Oral Q6H PRN Laveda Abbe, NP   25 mg at 12/12/16 2135  . loratadine (CLARITIN) tablet 10 mg  10 mg Oral Daily Simon, Spencer E, PA-C      . magnesium hydroxide (MILK OF MAGNESIA) suspension 30 mL  30 mL Oral Daily PRN  Laveda Abbe, NP      . pneumococcal 23 valent vaccine (PNU-IMMUNE) injection 0.5 mL  0.5 mL Intramuscular Tomorrow-1000 Laveda Abbe, NP      . traZODone (DESYREL) tablet 50 mg  50 mg Oral QHS PRN Laveda Abbe, NP   50 mg at 12/12/16 2135    Lab Results:  No results found for this or any previous visit (from the past 48 hour(s)).  Blood Alcohol level:  Lab Results  Component Value Date   ETH <5 12/11/2016   ETH <5 11/26/2016    Metabolic Disorder Labs: No results found for: HGBA1C, MPG No results found for: PROLACTIN No results found for: CHOL, TRIG, HDL, CHOLHDL, VLDL, LDLCALC  Physical Findings: AIMS: Facial and Oral Movements Muscles of Facial Expression: None, normal Lips and Perioral Area: None, normal Jaw: None, normal Tongue: None, normal,Extremity Movements Upper (arms, wrists, hands, fingers): None, normal Lower (legs, knees, ankles, toes): None, normal, Trunk Movements Neck, shoulders, hips: None, normal, Overall Severity Severity of abnormal movements (highest score from questions above): None, normal Incapacitation due to abnormal movements: None, normal Patient's awareness of abnormal movements (rate only patient's report): No Awareness, Dental Status Current problems with teeth and/or dentures?: No Does patient usually wear dentures?: No  CIWA:    COWS:     Musculoskeletal: Strength & Muscle Tone: within normal limits Gait & Station: normal Patient leans: N/A  Psychiatric Specialty Exam: Physical Exam  Nursing note and vitals reviewed. Constitutional: He is oriented to person, place, and time.  Neurological: He is alert and oriented to person, place, and time.    Review of Systems  Psychiatric/Behavioral: Positive for depression and substance abuse. Negative for hallucinations and suicidal ideas. The patient is nervous/anxious and has insomnia.   All other systems reviewed and are negative.   Blood pressure (!) 158/106,  pulse 69, temperature 97.7 F (36.5 C), temperature source Oral, resp. rate 18, height 5\' 9"  (1.753 m), weight 237 lb (107.5 kg), SpO2 98 %.Body mass index is 35 kg/m.  General Appearance: Guarded  Eye Contact:  Fair  Speech:  Clear and Coherent and Normal Rate  Volume:  Normal  Mood:  Depressed and Hopeless  Affect:  flat  Thought Process:  Coherent, Goal Directed, Linear and Descriptions of Associations: Intact  Orientation:  Full (Time, Place, and Person)  Thought Content:  Focused on treatment options  Suicidal Thoughts:  No  Homicidal Thoughts:  No  Memory:  Immediate;   Fair Recent;   Fair Remote;   Fair  Judgement:  Fair  Insight:  Shallow  Psychomotor Activity:  Normal  Concentration:  Concentration: Fair and Attention Span: Fair  Recall:  [employer] of Knowledge:  Fair  Language:  Fair  Akathisia:  No  Handed:    AIMS (  if indicated):     Assets:  Communication Skills Resilience Social Support  ADL's:  Intact  Cognition:  WNL  Sleep:  Number of Hours: 6.5     Treatment Plan Summary:   Substance abuse mood disorder: Patient presents with a depressed mood and flat affect. He shows little progression.  His seems to shut down and does not elaborate on issues that are causing his depressed mood  He denies SI, AVH at this time. To reduce symptoms to baseline and improve overall level of functioning will continue the following treatment plan without adjustments;   Medications: -Continue trazodone 50mg  po qhs prn insomnia -Continue vistaril 25mg  po q6h prn anxiety -Continue Hydrodiuril 25 mg po daily for thyroid disorder -Continue Claritin 10 mg po daily for allergies *Pt continues to not be receptive to SSRI at this time and is minimizing symptoms   Denzil Magnuson, NP 12/14/2016, 11:04 AM   Patient ID: Don Vaughn, male   DOB: June 25, 1985, 31 y.o.   MRN: [id number]

## 2016-12-14 NOTE — Plan of Care (Signed)
Problem: Self-Concept: Goal: Ability to disclose and discuss suicidal ideas will improve Outcome: Not Progressing Pt reports increased depression today, reports that he does not want to talk to anyone

## 2016-12-14 NOTE — Plan of Care (Signed)
Problem: Safety: Goal: Periods of time without injury will increase Outcome: Progressing Pt. remains a low fall risk, denies SI/HI/AVH at this time, Q 15 checks in effect.    

## 2016-12-14 NOTE — Progress Notes (Signed)
Recreation Therapy Notes  Date: 12/14/2016 Time: 9:30am Location: [address] Dayroom  Group Topic: Stress Management  Goal Area(s) Addresses:  Patient will verbalize importance of using healthy stress management.  Patient will identify positive emotions associated with healthy stress management.   Intervention: Stress Management  Activity : Guided Energy Starter. Recreation Therapy Intern introduced the stress management technique of a guided energy starter. Recreation Therapy Intern read a script that allowed patients to work on stretching and relaxing some muscles to help them feel energized. Recreation Therapy Intern played calming music. Patients were to follow along as script was read to engage in the activity.  Education: Stress Management, Discharge Planning.   Education Outcome: Needs additional edcuation  Clinical Observations/Feedback: Pt did not attend group.  Rachel Meyer, Recreation Therapy Intern   Keiley Levey, LRT/CTRS  

## 2016-12-15 ENCOUNTER — Encounter (HOSPITAL_COMMUNITY): Payer: Self-pay | Admitting: Behavioral Health

## 2016-12-15 NOTE — Progress Notes (Signed)
  [facility]  Will you be returning to the same living situation after discharge:  No. [facility]  At discharge, do you have transportation home?: Yes,  train ticket and $2.00 for bus pass provided. Pt must discharge no later than 10am on Friday, 12/16/16. train leaves approximately 12pm  Do you have the ability to pay for your medications: Yes,  mental health  Release of information consent forms completed and submitted to medical records by CSW.  Patient to Follow up at: Follow-up Information    [facility] Follow up.   Why:  Walk in within 7 days of hospital discharge to be assessed for outpatient mental health services including: medication management; individual counseling, and support groups. Walk in hours: Mon- Fri 8am-12pm. Thank you.  Contact information: [address] [place], [place] [address] Phone: [phone] Fax: [phone]          Next level of care provider has [facility] to [facility] Link:no  Safety Planning and Suicide Prevention discussed: Yes,  SPE completed with pt; pt declined to consent to family contact. SPI pamphlet and Mobile Crisis information provided.  Have you used any form of tobacco in the last 30 days? (Cigarettes, Smokeless Tobacco, Cigars, and/or Pipes): Yes  Has patient been referred to the Quitline?: Patient refused referral  Patient has been referred for addiction treatment: Yes  Pulte HomesHeather N Smart, LCSW 12/15/2016, 2:30 PM

## 2016-12-15 NOTE — Tx Team (Signed)
Interdisciplinary Treatment and Diagnostic Plan Update  12/15/2016 Time of Session: 2122QM Don Vaughn MRN: [id number]  Principal Diagnosis: MDD, recurrent, severe, without psychosis  Secondary Diagnoses: Principal Problem:   Substance induced mood disorder ([place]) Active Problems:   MDD (major depressive disorder), recurrent severe, without psychosis ([place])   Current Medications:  Current Facility-Administered Medications  Medication Dose Route Frequency Provider Last Rate Last Dose  . acetaminophen (TYLENOL) tablet 650 mg  650 mg Oral Q6H PRN Ethelene Hal, NP      . alum & mag hydroxide-simeth (MAALOX/MYLANTA) 200-200-20 MG/5ML suspension 30 mL  30 mL Oral Q4H PRN Ethelene Hal, NP      . hydrochlorothiazide (HYDRODIURIL) tablet 25 mg  25 mg Oral Daily Ethelene Hal, NP   25 mg at 12/12/16 1217  . hydrOXYzine (ATARAX/VISTARIL) tablet 25 mg  25 mg Oral Q6H PRN Ethelene Hal, NP   25 mg at 12/12/16 2135  . loratadine (CLARITIN) tablet 10 mg  10 mg Oral Daily Simon, Spencer E, PA-C      . magnesium hydroxide (MILK OF MAGNESIA) suspension 30 mL  30 mL Oral Daily PRN Ethelene Hal, NP      . pneumococcal 23 valent vaccine (PNU-IMMUNE) injection 0.5 mL  0.5 mL Intramuscular Tomorrow-1000 Ethelene Hal, NP      . traZODone (DESYREL) tablet 50 mg  50 mg Oral QHS PRN Ethelene Hal, NP   50 mg at 12/12/16 2135   PTA Medications: Prescriptions Prior to Admission  Medication Sig Dispense Refill Last Dose  . amLODipine (NORVASC) 10 MG tablet Take 10 mg by mouth daily.   Past Month at Unknown time  . amLODipine (NORVASC) 5 MG tablet Take 1 tablet (5 mg total) by mouth daily. For elevated blood pressure (Patient not taking: Reported on 12/11/2016) 30 tablet 0 Not Taking at Unknown time  . buPROPion (WELLBUTRIN XL) 150 MG 24 hr tablet Take 1 tablet (150 mg total) by mouth daily. For depression (Patient not taking: Reported on 12/11/2016) 30 tablet  0 Not Taking at Unknown time  . buPROPion (WELLBUTRIN XL) 300 MG 24 hr tablet Take 300 mg by mouth daily.   Past Month at Unknown time  . cloNIDine (CATAPRES) 0.1 MG tablet Take 1 tablet (0.1 mg total) by mouth 2 (two) times daily. For high blood pressure 30 tablet 0 Past Month at Unknown time  . gabapentin (NEURONTIN) 300 MG capsule Take 300 mg by mouth 3 (three) times daily.   Past Month at Unknown time  . hydrochlorothiazide (HYDRODIURIL) 25 MG tablet Take 25 mg by mouth daily.   Past Month at Unknown time  . hydrOXYzine (ATARAX/VISTARIL) 25 MG tablet Take 1 tablet (25 mg total) by mouth 3 (three) times daily as needed for anxiety. 60 tablet 0 Past Month at Unknown time  . traZODone (DESYREL) 150 MG tablet Take 1 tablet (150 mg total) by mouth at bedtime. For sleep (Patient not taking: Reported on 12/11/2016) 30 tablet 0 Not Taking at Unknown time  . ziprasidone (GEODON) 40 MG capsule Take 40 mg by mouth at bedtime.   Past Month at Unknown time    Patient Stressors: Financial difficulties Medication change or noncompliance  Patient Strengths: Ability for insight Capable of independent living  Treatment Modalities: Medication Management, Group therapy, Case management,  1 to 1 session with clinician, Psychoeducation, Recreational therapy.   Physician Treatment Plan for Primary Diagnosis: MDD, recurrent, severe, without psychosis  Medication Management: Evaluate patient's response, side effects,  and tolerance of medication regimen.  Therapeutic Interventions: 1 to 1 sessions, Unit Group sessions and Medication administration.  Evaluation of Outcomes: Adequate for discharge   Physician Treatment Plan for Secondary Diagnosis: Principal Problem:   Substance induced mood disorder ([place]) Active Problems:   MDD (major depressive disorder), recurrent severe, without psychosis ([place])   Medication Management: Evaluate patient's response, side effects, and tolerance of medication  regimen.  Therapeutic Interventions: 1 to 1 sessions, Unit Group sessions and Medication administration.  Evaluation of Outcomes: Not Met   RN Treatment Plan for Primary Diagnosis: MDD, recurrent, severe, without psychosis Long Term Goal(s): Knowledge of disease and therapeutic regimen to maintain health will improve  Short Term Goals: Ability to verbalize frustration and anger appropriately will improve, Ability to participate in decision making will improve and Ability to disclose and discuss suicidal ideas  Medication Management: RN will administer medications as ordered by provider, will assess and evaluate patient's response and provide education to patient for prescribed medication. RN will report any adverse and/or side effects to prescribing provider.  Therapeutic Interventions: 1 on 1 counseling sessions, Psychoeducation, Medication administration, Evaluate responses to treatment, Monitor vital signs and CBGs as ordered, Perform/monitor CIWA, COWS, AIMS and Fall Risk screenings as ordered, Perform wound care treatments as ordered.  Evaluation of Outcomes: Adequate for discharge    LCSW Treatment Plan for Primary Diagnosis: MDD, recurrent, severe, without psychosis Long Term Goal(s): Safe transition to appropriate next level of care at discharge, Engage patient in therapeutic group addressing interpersonal concerns.  Short Term Goals: Engage patient in aftercare planning with referrals and resources, Facilitate patient progression through stages of change regarding substance use diagnoses and concerns and Identify triggers associated with mental health/substance abuse issues  Therapeutic Interventions: Assess for all discharge needs, 1 to 1 time with Social worker, Explore available resources and support systems, Assess for adequacy in community support network, Educate family and significant other(s) on suicide prevention, Complete Psychosocial Assessment, Interpersonal group  therapy.  Evaluation of Outcomes: Adequate for discharge    Progress in Treatment: Attending groups: Intermittently  Participating in groups: yes, when he attends  Taking medication as prescribed: Yes. Toleration medication: Yes. Family/Significant other contact made: SPE completed with pt; pt declined to consent to family contact.  Patient understands diagnosis: No. poor insight at this time.  Discussing patient identified problems/goals with staff: Yes. Medical problems stabilized or resolved: Yes. Denies suicidal/homicidal ideation: Yes, per self report.  Issues/concerns per patient self-inventory: No. Other: n/a   New problem(s) identified: Yes, Describe:  pt remains isolative in room; declining to engage in programming or aftercare planning  New Short Term/Long Term Goal(s): detox,engagement in treatment and aftercare planing; medication management for mood stabilization; development of comprehensive mental wellness/sobriety plan.   Discharge Plan or Barriers: CSW and pt called [facility]. Pt has ID. They are willing to see him and admit him into program on Friday-pt asked to be there at 4pm or sooner. Pt provided with train ticket -arrives around 2pm and $2.00 for bus fair to get to [facility]. He will follow-up at [place] for outpatient mental health care.    Reason for Continuation of Hospitalization: medication management   Estimated Length of Stay: Friday at [place]. Via train.   Attendees: Patient: 12/15/2016 2:31 PM  Physician: Dr. Parke Poisson MD 12/15/2016 2:31 PM  Nursing: Lianne Cure RN 12/15/2016 2:31 PM  RN Care Manager: Lars Pinks CM 12/15/2016 2:31 PM  Social Worker: Maxie Better, LCSW 12/15/2016 2:31 PM  Recreational Therapist: x 12/15/2016 2:31 PM  Other: Lindell Spar NP; Mordecai Maes NP; Darnelle Maffucci Money NP 12/15/2016 2:31 PM  Other:  12/15/2016 2:31 PM  Other: 12/15/2016 2:31 PM    Scribe for Treatment Team: [place], LCSW 12/15/2016 2:31 PM

## 2016-12-15 NOTE — Progress Notes (Signed)
D:Pt did not get up until late today and refused his morning medications. He is out in the dayroom currently laughing and interacting. [facility] and laughed when Clinical research associatewriter mentioned that he slept late in the morning saying that his father was probably a grisly bear. A:Offered support and 15 minute checks. R:Pt denies si and hi. Safety maintained on the unit.

## 2016-12-15 NOTE — Progress Notes (Signed)
DATA ACTION RESPONSE  Objective- Pt. is visible in the dayroom, seen eating a snack and interacting with peers. Presents with an animated affect and mood. C/o of anxiety and insomnia this evening. Subjective- Denies having any SI/HI/AVH/Pain at this time. Pt. states "I'm going home tomorrow". Is cooperative and remain safe on the unit.  1:1 interaction in private to establish rapport. Encouragement, education, & support given from staff. PRN vistaril and Trazodone requested and given. Will re-eval.     Safety maintained with Q 15 checks. Continue with POC.   '

## 2016-12-15 NOTE — Plan of Care (Signed)
Problem: Activity: Goal: Sleeping patterns will improve Outcome: Progressing Pt slept 6.5 hrs last night.    

## 2016-12-15 NOTE — Plan of Care (Signed)
Problem: Medication: Goal: Compliance with prescribed medication regimen will improve Outcome: Not Progressing Pt has been refusing morning medications.

## 2016-12-15 NOTE — Progress Notes (Signed)
Patient ID: Don Vaughn, male   DOB: 07/18/1985, 31 y.o.   MRN: [id number]  [facility] MD Progress Note  12/15/2016 10:24 AM Don Vaughn  MRN:  [id number]   Subjective: "Not sleeping well. Not doing good. There is nothing to talk about.".""   Objective: Face to face evaluation completed, case discussed with treatment team, chart reviewed. Don Vaughn is a 31 year old male who was admitted to [facility] for SI. Patient has an extensive psychiatric background that includes multiple psychiatric admission and SUD. He denies withdrawal symptoms at this time.   During this evaluation, patient is alert/oriented x4 and calm and appropriate to situation. He continues to minimally participate in therapeutic milieu. His mood remains depressed and affect very flat and guarded. Again, patient was assessed in his room. When writer approached patient while laying in bed, patient placed the cover over his face. He continued to endorses having a bad day yet continue to not elaborate on any issues or concerns. When asked if he was having any SI either active or passive, HI or hallucinations he replied, " I don't think so."  He endorse slight improvement in depressive symptoms although continued to endorse some feelings of hopelessness and anxiety. When asked about his discharge plan he did open up and reported that he was homeless  and did not have any plans in light of living arrangements. He reports a desire to go to a long term institution for his substance abuse once discharged.   He seems to be tolerating medications well and denies side effects. Enodrses fair appetite and poor sleeping pattern. At this time, he is able to contract for safety on the unit.     Principal Problem: Substance induced mood disorder (HCC) Diagnosis:   Patient Active Problem List   Diagnosis Date Noted  . Cannabis use disorder, moderate, dependence (HCC) [F12.20] 12/11/2016  . MDD (major depressive disorder), recurrent severe, without psychosis  ([facility]) [F33.2] 12/11/2016  . Cocaine abuse [F14.10] 12/02/2016  . Polysubstance abuse [F19.10] 11/27/2016  . Substance induced mood disorder (HCC) [F19.94] 11/27/2016  . Severe episode of recurrent major depressive disorder, without psychotic features (HCC) [F33.2]   . Major depressive disorder, single episode, severe without psychotic features (HCC) [F32.2] 11/02/2016  . Stimulant use disorder [F15.90] 11/01/2016   Total Time spent with patient: 30 minutes  Past Psychiatric History:see H&P  Past Medical History:  Past Medical History:  Diagnosis Date  . Depression   . Hypertension     Past Surgical History:  Procedure Laterality Date  . EXTERNAL EAR SURGERY Right    "it was cutt off and sutured back on"   Family History:  Family History  Problem Relation Age of Onset  . Mental illness Neg Hx    Family Psychiatric  History: see H&P Social History:  History  Alcohol Use No     History  Drug Use  . Types: Marijuana    Social History   Social History  . Marital status: Married    Spouse name: N/A  . Number of children: N/A  . Years of education: N/A   Social History Main Topics  . Smoking status: Current Every Day Smoker  . Smokeless tobacco: Never Used  . Alcohol use No  . Drug use: Yes    Types: Marijuana  . Sexual activity: Not Currently   Other Topics Concern  . None   Social History Narrative  . None   Additional Social History:  Sleep: Poor  Appetite:  Fair  Current Medications: Current Facility-Administered Medications  Medication Dose Route Frequency Provider Last Rate Last Dose  . acetaminophen (TYLENOL) tablet 650 mg  650 mg Oral Q6H PRN Laveda AbbeParks, Laurie Britton, NP      . alum & mag hydroxide-simeth (MAALOX/MYLANTA) 200-200-20 MG/5ML suspension 30 mL  30 mL Oral Q4H PRN Laveda AbbeParks, Laurie Britton, NP      . hydrochlorothiazide (HYDRODIURIL) tablet 25 mg  25 mg Oral Daily Laveda AbbeParks, Laurie Britton, NP   25 mg at  12/12/16 1217  . hydrOXYzine (ATARAX/VISTARIL) tablet 25 mg  25 mg Oral Q6H PRN Laveda AbbeParks, Laurie Britton, NP   25 mg at 12/12/16 2135  . loratadine (CLARITIN) tablet 10 mg  10 mg Oral Daily Simon, Spencer E, PA-C      . magnesium hydroxide (MILK OF MAGNESIA) suspension 30 mL  30 mL Oral Daily PRN Laveda AbbeParks, Laurie Britton, NP      . pneumococcal 23 valent vaccine (PNU-IMMUNE) injection 0.5 mL  0.5 mL Intramuscular Tomorrow-1000 Laveda AbbeParks, Laurie Britton, NP      . traZODone (DESYREL) tablet 50 mg  50 mg Oral QHS PRN Laveda AbbeParks, Laurie Britton, NP   50 mg at 12/12/16 2135    Lab Results:  No results found for this or any previous visit (from the past 48 hour(s)).  Blood Alcohol level:  Lab Results  Component Value Date   ETH <5 12/11/2016   ETH <5 11/26/2016    Metabolic Disorder Labs: No results found for: HGBA1C, MPG No results found for: PROLACTIN No results found for: CHOL, TRIG, HDL, CHOLHDL, VLDL, LDLCALC  Physical Findings: AIMS: Facial and Oral Movements Muscles of Facial Expression: None, normal Lips and Perioral Area: None, normal Jaw: None, normal Tongue: None, normal,Extremity Movements Upper (arms, wrists, hands, fingers): None, normal Lower (legs, knees, ankles, toes): None, normal, Trunk Movements Neck, shoulders, hips: None, normal, Overall Severity Severity of abnormal movements (highest score from questions above): None, normal Incapacitation due to abnormal movements: None, normal Patient's awareness of abnormal movements (rate only patient's report): No Awareness, Dental Status Current problems with teeth and/or dentures?: No Does patient usually wear dentures?: No  CIWA:    COWS:     Musculoskeletal: Strength & Muscle Tone: within normal limits Gait & Station: normal Patient leans: N/A  Psychiatric Specialty Exam: Physical Exam  Nursing note and vitals reviewed. Constitutional: He is oriented to person, place, and time.  Neurological: He is alert and oriented to  person, place, and time.    Review of Systems  Psychiatric/Behavioral: Positive for depression and substance abuse. Negative for hallucinations and suicidal ideas. The patient is nervous/anxious and has insomnia.   All other systems reviewed and are negative.   Blood pressure (!) 158/106, pulse 69, temperature 97.7 F (36.5 C), temperature source Oral, resp. rate 18, height 5\' 9"  (1.753 m), weight 237 lb (107.5 kg), SpO2 98 %.Body mass index is 35 kg/m.  General Appearance: Guarded  Eye Contact:  Fair  Speech:  Clear and Coherent and Normal Rate  Volume:  Normal  Mood:  Depressed and Hopeless  Affect:  flat  Thought Process:  Coherent, Goal Directed, Linear and Descriptions of Associations: Intact  Orientation:  Full (Time, Place, and Person)  Thought Content:  Focused on treatment options  Suicidal Thoughts:  No  Homicidal Thoughts:  No  Memory:  Immediate;   Fair Recent;   Fair Remote;   Fair  Judgement:  Fair  Insight:  Shallow  Psychomotor Activity:  Normal  Concentration:  Concentration: Fair and Attention Span: Fair  Recall:  [employer] of Knowledge:  Fair  Language:  Fair  Akathisia:  No  Handed:    AIMS (if indicated):     Assets:  Communication Skills Resilience Social Support  ADL's:  Intact  Cognition:  WNL  Sleep:  Number of Hours: 6.5     Treatment Plan Summary:   Substance abuse mood disorder: No changes noted in psychiatric condition. He continues to  present with a depressed mood and flat affect.  He continues to participate minimally. His insight is lacking. Responds vague with reporting SI, AVH although he is able to contract for safety. To continue reduce symptoms to baseline and improve overall level of functioning will continue the following treatment plan without adjustments;   Per CSW, patient will be discharged to [facility] 12/16/2016.    Medications: -Continue trazodone 50mg  po qhs prn insomnia -Continue vistaril 25mg  po q6h prn  anxiety -Continue Hydrodiuril 25 mg po daily for thyroid disorder -Continue Claritin 10 mg po daily for allergies *Pt continues to not be receptive to SSRI at this time and is minimizing symptoms   Don Magnuson, NP 12/15/2016, 10:24 AM   Patient ID: Don Vaughn, male   DOB: [birth year], 31 y.o.   MRN: [id number] Agree with NP Progress Note

## 2016-12-15 NOTE — [facility] Group Notes (Signed)
[facility] LCSW Group Therapy  12/15/2016 2:52 PM  Type of Therapy:  Group Therapy  Participation Level:  Minimal  Participation Quality:  Attentive  Affect:  Appropriate  Cognitive:  Oriented  Insight:  Improving  Engagement in Therapy:  Improving  Modes of Intervention:  Discussion, Education, Exploration, Socialization and Support  Summary of Progress/Problems:  Finding Balance in Life. Today's group focused on defining balance in one's own words, identifying things that can knock one off balance, and exploring healthy ways to maintain balance in life. Group members were asked to provide an example of a time when they felt off balance, describe how they handled that situation,and process healthier ways to regain balance in the future. Group members were asked to share the most important tool for maintaining balance that they learned [facility] and how they plan to apply this method after discharge.   Don Vaughn N Smart LCSW 12/15/2016, 2:52 PM

## 2016-12-16 DIAGNOSIS — X789XXA Intentional self-harm by unspecified sharp object, initial encounter: Secondary | ICD-10-CM

## 2016-12-16 DIAGNOSIS — T1491XA Suicide attempt, initial encounter: Secondary | ICD-10-CM

## 2016-12-16 DIAGNOSIS — F1290 Cannabis use, unspecified, uncomplicated: Secondary | ICD-10-CM

## 2016-12-16 MED ORDER — TRAZODONE HCL 50 MG PO TABS: 50.0000 mg | ORAL_TABLET | Freq: Every evening | ORAL | 0 refills | Status: AC | PRN

## 2016-12-16 MED ORDER — HYDROXYZINE HCL 25 MG PO TABS: 25.0000 mg | ORAL_TABLET | Freq: Four times a day (QID) | ORAL | 0 refills | Status: AC | PRN

## 2016-12-16 MED ORDER — HYDROCHLOROTHIAZIDE 25 MG PO TABS: 25.0000 mg | ORAL_TABLET | Freq: Every day | ORAL | 0 refills | Status: AC

## 2016-12-16 NOTE — Discharge Summary (Signed)
Physician Discharge Summary Note  Patient:  Don Vaughn is an 31 y.o., male MRN:  [id number] DOB:  23-May-1985 Patient phone:  [phone] (home)  Patient address:   [address] [place] [address],  Total Time spent with patient: 30 minutes  Date of Admission:  12/11/2016 Date of Discharge: 12/16/16  Reason for Admission:  Worsening depression with Suicide attempt  Principal Problem: Substance induced mood disorder [facility]) Discharge Diagnoses: Patient Active Problem List   Diagnosis Date Noted  . Cannabis use disorder, moderate, dependence (HCC) [F12.20] 12/11/2016  . MDD (major depressive disorder), recurrent severe, without psychosis (HCC) [F33.2] 12/11/2016  . Cocaine abuse [F14.10] 12/02/2016  . Polysubstance abuse [F19.10] 11/27/2016  . Substance induced mood disorder (HCC) [F19.94] 11/27/2016  . Severe episode of recurrent major depressive disorder, without psychotic features (HCC) [F33.2]   . Major depressive disorder, single episode, severe without psychotic features (HCC) [F32.2] 11/02/2016  . Stimulant use disorder [F15.90] 11/01/2016    Past Psychiatric History: History of SUD. Repeated admission on account of suicidal thoughts. Unable to explore details at this time. Not on any psychiatric medication currently  Past Medical History:  Past Medical History:  Diagnosis Date  . Depression   . Hypertension     Past Surgical History:  Procedure Laterality Date  . EXTERNAL EAR SURGERY Right    "it was cutt off and sutured back on"   Family History:  Family History  Problem Relation Age of Onset  . Mental illness Neg Hx    Family Psychiatric  History: None reported Social History:  History  Alcohol Use No     History  Drug Use  . Types: Marijuana    Social History   Social History  . Marital status: Married    Spouse name: N/A  . Number of children: N/A  . Years of education: N/A   Social History Main Topics  . Smoking status: Current  Every Day Smoker  . Smokeless tobacco: Never Used  . Alcohol use No  . Drug use: Yes    Types: Marijuana  . Sexual activity: Not Currently   Other Topics Concern  . None   Social History Narrative  . None    Hospital Course:  On admission: 31 yo AAM, single, lives with a room mate. Recently discharged from our unit and [facility]. Presented to to the ER a week ago for suicidal thoughts. At that time expressed thoughts to shoot self with a gun.  This admission was on account of suicidal behavior. He had attempted to slit his wrist. His roommate wrestled the weapon off his hands. He had expressed being stressed by legal issues, bing unemployed and on going difficulties in his relationship. UDS was positive for cocaine amphetamine and THC. Potassium is 3.4. Other labs are essentially normal.  Patient has been in his room sleeping. Repeated attempt to engage with him was futile. Stayed in bed with his eyes closed. Says he is going through a bunch of things. When asked to elaborate, he told me that he would think about it and let me know later. When probed specifically, he acknowledged poor relationship with is family. Would not tell me any specifics. Says he has been arguing a lot with his significant other. He feels as if there is a plot against him. Says he does not know the specific. Denies any hallucinations. Denies  any thoughts to go after others. Says he has a hand gun at home. Denies any legal issues. Denies any other  stressors.  Dismissive of his substance use. Says he would talk later as he is very tired. 12/15/16: Patient is alert/oriented x4 and calm and appropriate to situation. He continues to minimally participate in therapeutic milieu. His mood remains depressed and affect very flat and guarded. Again, patient was assessed in his room. When writer approached patient while laying in bed, patient placed the cover over his face. He continued to endorses having a bad day yet continue to not  elaborate on any issues or concerns. When asked if he was having any SI either active or passive, HI or hallucinations he replied, " I don't think so."  He endorse slight improvement in depressive symptoms although continued to endorse some feelings of hopelessness and anxiety. When asked about his discharge plan he did open up and reported that he was homeless  and did not have any plans in light of living arrangements. He reports a desire to go to a long term institution for his substance abuse once discharged.   He seems to be tolerating medications well and denies side effects. Enodrses fair appetite and poor sleeping pattern. At this time, he is able to contract for safety on the unit. 12/16/16: He has improved while staying here and interacted appropriately .Patient is cooperative and pleasant. He is denying SI/HI/AVH. He has agreed to [facility].   Physical Findings: AIMS: Facial and Oral Movements Muscles of Facial Expression: None, normal Lips and Perioral Area: None, normal Jaw: None, normal Tongue: None, normal,Extremity Movements Upper (arms, wrists, hands, fingers): None, normal Lower (legs, knees, ankles, toes): None, normal, Trunk Movements Neck, shoulders, hips: None, normal, Overall Severity Severity of abnormal movements (highest score from questions above): None, normal Incapacitation due to abnormal movements: None, normal Patient's awareness of abnormal movements (rate only patient's report): No Awareness, Dental Status Current problems with teeth and/or dentures?: No Does patient usually wear dentures?: No  CIWA:    COWS:     Musculoskeletal: Strength & Muscle Tone: within normal limits Gait & Station: normal Patient leans: N/A  Psychiatric Specialty Exam: Physical Exam  Nursing note and vitals reviewed. Constitutional: He is oriented to person, place, and time. He appears well-developed and well-nourished.  Cardiovascular: Normal rate.    Musculoskeletal: Normal range of motion.  Neurological: He is alert and oriented to person, place, and time.  Skin: Skin is warm.    Review of Systems  Constitutional: Negative.   HENT: Negative.   Eyes: Negative.   Respiratory: Negative.   Cardiovascular: Negative.   Gastrointestinal: Negative.   Genitourinary: Negative.   Musculoskeletal: Negative.   Skin: Negative.   Neurological: Negative.   Endo/Heme/Allergies: Negative.     Blood pressure 148/95, pulse 80, temperature 97.7 F (36.5 C), temperature source Oral, resp. rate 18, height 5\' 9"  (1.753 m), weight 107.5 kg (237 lb), SpO2 98 %.Body mass index is 35 kg/m.  General Appearance: Casual  Eye Contact:  Good  Speech:  Clear and Coherent and Normal Rate  Volume:  Normal  Mood:  Depressed  Affect:  Flat  Thought Process:  Coherent and Descriptions of Associations: Intact  Orientation:  Full (Time, Place, and Person)  Thought Content:  WDL  Suicidal Thoughts:  No  Homicidal Thoughts:  No  Memory:  Immediate;   Good Recent;   Good  Judgement:  Good  Insight:  Good  Psychomotor Activity:  Normal  Concentration:  Concentration: Good and Attention Span: Good  Recall:  Good  Fund of Knowledge:  Good  Language:  Good  Akathisia:  No  Handed:  Right  AIMS (if indicated):     Assets:  Social Support  ADL's:  Intact  Cognition:  WNL  Sleep:  Number of Hours: 6.25     Have you used any form of tobacco in the last 30 days? (Cigarettes, Smokeless Tobacco, Cigars, and/or Pipes): Yes  Has this patient used any form of tobacco in the last 30 days? (Cigarettes, Smokeless Tobacco, Cigars, and/or Pipes)  No  Blood Alcohol level:  Lab Results  Component Value Date   ETH <5 12/11/2016   ETH <5 11/26/2016    Metabolic Disorder Labs:  No results found for: HGBA1C, MPG No results found for: PROLACTIN No results found for: CHOL, TRIG, HDL, CHOLHDL, VLDL, LDLCALC  See Psychiatric Specialty Exam and Suicide Risk Assessment  completed by Attending Physician prior to discharge.  Discharge destination:  Home  Is patient on multiple antipsychotic therapies at discharge:  No   Has Patient had three or more failed trials of antipsychotic monotherapy by history:  No  Recommended Plan for Multiple Antipsychotic Therapies: NA   Allergies as of 12/16/2016   No Known Allergies     Medication List    STOP taking these medications   amLODipine 10 MG tablet Commonly known as:  NORVASC   amLODipine 5 MG tablet Commonly known as:  NORVASC   buPROPion 150 MG 24 hr tablet Commonly known as:  WELLBUTRIN XL   buPROPion 300 MG 24 hr tablet Commonly known as:  WELLBUTRIN XL   cloNIDine 0.1 MG tablet Commonly known as:  CATAPRES   gabapentin 300 MG capsule Commonly known as:  NEURONTIN   ziprasidone 40 MG capsule Commonly known as:  GEODON     TAKE these medications     Indication  hydrochlorothiazide 25 MG tablet Commonly known as:  HYDRODIURIL Take 1 tablet (25 mg total) by mouth daily.  Indication:  High Blood Pressure Disorder   hydrOXYzine 25 MG tablet Commonly known as:  ATARAX/VISTARIL Take 1 tablet (25 mg total) by mouth every 6 (six) hours as needed for anxiety. What changed:  when to take this  Indication:  Feeling Anxious   traZODone 50 MG tablet Commonly known as:  DESYREL Take 1 tablet (50 mg total) by mouth at bedtime as needed for sleep. What changed:  medication strength  how much to take  when to take this  reasons to take this  additional instructions  Indication:  Trouble Sleeping      Follow-up Information    [facility] Follow up.   Why:  Walk in within 7 days of hospital discharge to be assessed for outpatient mental health services including: medication management; individual counseling, and support groups. Walk in hours: Mon- Fri 8am-12pm. Thank you.  Contact information: [address] [place], [place] [address] Phone: [phone] Fax: [phone]           Follow-up recommendations:  Continue activity as tolerated. Continue diet as recommended by your PCP. Ensure to keep all appointments with outpatient providers.  Comments:  Patient is instructed prior to discharge to: Take all medications as prescribed by his/her mental healthcare provider. Report any adverse effects and or reactions from the medicines to his/her outpatient provider promptly. Patient has been instructed & cautioned: To not engage in alcohol and or illegal drug use while on prescription medicines. In the event of worsening symptoms, patient is instructed to call the crisis hotline, 911 and or go to the  nearest ED for appropriate evaluation and treatment of symptoms. To follow-up with his/her primary care provider for your other medical issues, concerns and or health care needs.    Signed: Gerlene Burdock Juniper Cobey, FNP 12/16/2016, 8:45 AM

## 2016-12-16 NOTE — Progress Notes (Signed)
Data. Patient denies SI/HI/AVH. Patient interacting minimally with staff and other patients. Refused to get up for medications. Minimal eye contact and affect is sullen. Action. Emotional support and encouragement offered. Education provided on medication, indications and side effect. Q 15 minute checks done for safety. Response. Safety on the unit maintained through 15 minute checks.  Medications taken as prescribed. Attended groups. Remained calm and appropriate through out shift.  Pt. discharged to lobby.  Belongings sheet reviewed and signed by pt. and all belongings, including medication samples and medication scripts,  sent home. Paperwork reviewed and pt. able to verbalize understanding of education. Pt. in no current distress and ambulatory.

## 2016-12-16 NOTE — Progress Notes (Signed)
Recreation Therapy Notes  Date: 12/16/2016 Time: 9:30am Location: [address] Dayroom  Group Topic: Stress Management  Goal Area(s) Addresses:  Patient will verbalize importance of using healthy stress management.  Patient will identify positive emotions associated with healthy stress management.   Intervention: Stress Management  Activity :  Guided Meditation. Recreation Therapy Intern introduced the stress management technique of guided meditation. Recreation Therapy Intern read a script that allowed patients to reach into their inner "chakra." Recreation Therapy Intern played calming meditation music. Patients were to follow along as script was read to engage in the activity.  Education: Stress Management, Discharge Planning.   Education Outcome: Needs additional education  Clinical Observations/Feedback: Pt did not attend group.  Ala Capri, Recreation Therapy Intern  

## 2016-12-16 NOTE — [facility] Suicide Risk Assessment (Signed)
[facility]   Principal Problem: Substance induced mood disorder [facility](HCC) Discharge Diagnoses:  Patient Active Problem List   Diagnosis Date Noted  . Cannabis use disorder, moderate, dependence (HCC) [F12.20] 12/11/2016  . MDD (major depressive disorder), recurrent severe, without psychosis (HCC) [F33.2] 12/11/2016  . Cocaine abuse [F14.10] 12/02/2016  . Polysubstance abuse [F19.10] 11/27/2016  . Substance induced mood disorder (HCC) [F19.94] 11/27/2016  . Severe episode of recurrent major depressive disorder, without psychotic features (HCC) [F33.2]   . Major depressive disorder, single episode, severe without psychotic features (HCC) [F32.2] 11/02/2016  . Stimulant use disorder [F15.90] 11/01/2016  Patient is a 31 year old male [facility] for stabilization and treatment of worsening of depression with suicidal ideation with a plan to shoot himself with a gun as he had relapsed. Patient states that he is using cocaine and cannabis. He states that he's not been on his medications for depression for 2-1/2 weeks, reports feeling sad all the time, having fatigue, being tearful, feeling worthless, being irritable and feeling guilty as he had relapsed  Patient states that he wants treatment, adds that [facility] him as he needs to be clean, take his medications for depression and have some job skills. He denies any suicidal ideation, any homicidal ideation, any delusions or paranoia. He also denies any self mutilating behaviors, any problems with sleep, appetite. On a scale of 0-10 with 0 being no symptoms in 10 being the worst, patient reports that his depression is now a 4 out of 10 as he knows he is going to get help, is on his way to recovery. Patient also denies any side effects with his medications  Total Time spent with patient: 30 minutes  Musculoskeletal: Strength & Muscle Tone: within normal limits Gait &  Station: normal Patient leans: N/A  Psychiatric Specialty Exam: Review of Systems  Constitutional: Negative.  Negative for fever, malaise/fatigue and weight loss.  HENT: Negative.  Negative for congestion and sore throat.   Eyes: Negative.  Negative for blurred vision, discharge and redness.  Respiratory: Negative for cough and wheezing.   Cardiovascular: Negative.  Negative for chest pain and palpitations.  Gastrointestinal: Negative.  Negative for abdominal pain, constipation, diarrhea, heartburn, nausea and vomiting.  Skin: Negative.  Negative for rash.  Neurological: Negative for dizziness, seizures, loss of consciousness, weakness and headaches.  Psychiatric/Behavioral: Positive for substance abuse. Negative for depression, hallucinations, memory loss and suicidal ideas. The patient is not nervous/anxious and does not have insomnia.     Blood pressure (!) 158/106, pulse 69, temperature 97.7 F (36.5 C), temperature source Oral, resp. rate 18, height 5\' 9"  (1.753 m), weight 107.5 kg (237 lb), SpO2 98 %.Body mass index is 35 kg/m.  General Appearance: Casual  Eye Contact::  Fair  Speech:  Clear and Coherent and Normal Rate409  Volume:  Normal  Mood:  Euthymic  Affect:  Congruent and Constricted  Thought Process:  Coherent, Goal Directed and Descriptions of Associations: Intact  Orientation:  Full (Time, Place, and Person)  Thought Content:  WDL and Logical  Suicidal Thoughts:  No  Homicidal Thoughts:  No  Memory:  Immediate;   Fair Recent;   Fair Remote;   Fair  Judgement:  Intact  Insight:  Present  Psychomotor Activity:  Normal  Concentration:  Fair  Recall:  [employer]Fair  Fund of Knowledge:Fair  Language: Fair  Akathisia:  No  Handed:  Right  AIMS (if indicated):  Assets:  Desire for Improvement Social Support  Sleep:  Number of Hours: 6.25  Cognition: WNL  ADL's:  Intact   Mental Status Per Nursing Assessment::   On Admission:  Suicidal ideation indicated by  patient, Suicide plan, Self-harm thoughts, Self-harm behaviors  Demographic Factors:  Male and Low socioeconomic status  Loss Factors: Financial problems/change in socioeconomic status  Historical Factors: Family history of mental illness or substance abuse  Risk Reduction Factors:   Positive therapeutic relationship  Continued Clinical Symptoms:  Alcohol/Substance Abuse/Dependencies More than one psychiatric diagnosis Previous Psychiatric Diagnoses and Treatments  Cognitive Features That Contribute To Risk:  None    Suicide Risk:  Minimal: No identifiable suicidal ideation.  Patients presenting with no risk factors but with morbid ruminations; may be classified as minimal risk based on the severity of the depressive symptoms  Follow-up Information    [facility] Follow up.   Why:  Walk in within 7 days of hospital discharge to be assessed for outpatient mental health services including: medication management; individual counseling, and support groups. Walk in hours: Mon- Fri 8am-12pm. Thank you.  Contact information: [address] [place], [place] [address] Phone: [phone] Fax: [phone]          Plan Of [place] toleratedollow-up recommendations:  Activity:  as tolerated Diet:  Heart healthy diet Other:  Keep follow-up appointment at [facility], [place] 12/16/2016, 9:35 AM

## 2018-09-07 ENCOUNTER — Other Ambulatory Visit: Payer: Self-pay

## 2018-09-07 ENCOUNTER — Encounter (HOSPITAL_COMMUNITY): Payer: Self-pay | Admitting: Emergency Medicine

## 2018-09-07 ENCOUNTER — Emergency Department (HOSPITAL_COMMUNITY)
Admission: EM | Admit: 2018-09-07 | Discharge: 2018-09-08 | Disposition: A | Discharge status: Home / Self Care | Payer: Self-pay | Attending: Emergency Medicine | Admitting: Emergency Medicine

## 2018-09-07 DIAGNOSIS — F17210 Nicotine dependence, cigarettes, uncomplicated: Secondary | ICD-10-CM | POA: Insufficient documentation

## 2018-09-07 DIAGNOSIS — F332 Major depressive disorder, recurrent severe without psychotic features: Secondary | ICD-10-CM | POA: Insufficient documentation

## 2018-09-07 DIAGNOSIS — I10 Essential (primary) hypertension: Secondary | ICD-10-CM | POA: Insufficient documentation

## 2018-09-07 DIAGNOSIS — Z79899 Other long term (current) drug therapy: Secondary | ICD-10-CM | POA: Insufficient documentation

## 2018-09-07 HISTORY — DX: Borderline personality disorder: F60.3

## 2018-09-07 HISTORY — DX: Attention-deficit hyperactivity disorder, unspecified type: F90.9

## 2018-09-07 LAB — CBC WITH DIFFERENTIAL/PLATELET
Abs Immature Granulocytes: 0.01 10*3/uL (ref 0.00–0.07)
Basophils Absolute: 0 10*3/uL (ref 0.0–0.1)
Basophils Relative: 1 %
Eosinophils Absolute: 0.1 10*3/uL (ref 0.0–0.5)
Eosinophils Relative: 2 %
HCT: 42.6 % (ref 39.0–52.0)
Hemoglobin: 14.1 g/dL (ref 13.0–17.0)
Immature Granulocytes: 0 %
Lymphocytes Relative: 27 %
Lymphs Abs: 1.6 10*3/uL (ref 0.7–4.0)
MCH: 29 pg (ref 26.0–34.0)
MCHC: 33.1 g/dL (ref 30.0–36.0)
MCV: 87.5 fL (ref 80.0–100.0)
Monocytes Absolute: 0.6 10*3/uL (ref 0.1–1.0)
Monocytes Relative: 10 %
Neutro Abs: 3.5 10*3/uL (ref 1.7–7.7)
Neutrophils Relative %: 60 %
Platelets: 224 10*3/uL (ref 150–400)
RBC: 4.87 MIL/uL (ref 4.22–5.81)
RDW: 13.9 % (ref 11.5–15.5)
WBC: 5.8 10*3/uL (ref 4.0–10.5)
nRBC: 0 % (ref 0.0–0.2)

## 2018-09-07 LAB — RAPID URINE DRUG SCREEN, HOSP PERFORMED
Amphetamines: NOT DETECTED
Barbiturates: NOT DETECTED
Benzodiazepines: NOT DETECTED
Cocaine: NOT DETECTED
Opiates: NOT DETECTED
Tetrahydrocannabinol: NOT DETECTED

## 2018-09-07 MED ORDER — Medication
10.00 | Status: DC
Start: 2018-09-08 — End: 2018-09-07

## 2018-09-07 MED ORDER — SM PERMETHRIN LICE 1 % EX LOTN
10.00 | TOPICAL_LOTION | CUTANEOUS | Status: DC
Start: 2018-09-08 — End: 2018-09-07

## 2018-09-07 MED ORDER — Medication
25.00 | Status: DC
Start: 2018-09-08 — End: 2018-09-07

## 2018-09-07 NOTE — ED Notes (Signed)
Bed: [facility]4 Expected date:  Expected time:  Means of arrival:  Comments: 

## 2018-09-07 NOTE — ED Provider Notes (Signed)
[facility]-EMERGENCY DEPT Provider Note   CSN: [id number] Arrival date & time: 09/07/18  2108    History   Chief Complaint Chief Complaint  Patient presents with  . Medical Clearance  . Suicidal    HPI Don Vaughn is a 33 y.o. male with a history of borderline personality disorder, ADHD, depression, HTN who presents to the emergency department with a chief complaint of suicidal ideation.  The patient reports that he has been feeling more suicidal after the last few weeks.  He reports that he recently lost his job.  He reports an associated plan and states that he would cut his wrists.  He reports a history of a previous suicide attempt and 2016.  States he attempted to hang himself, but reports that the banister that the rope was attached to broke and ended up knocking him unconscious.  He denies associated homicidal ideation, auditory or visual hallucinations.  He states "I have been feeling worthless.  I feel like I would be better off dead.  Sometimes I have to think of reasons to live because I feel like I am a burden on other people."  He reports hypersomnia.  He denies alcohol use.  He reports that he occasionally smokes tobacco and marijuana.  He reports a history of methamphetamine use and cocaine use, but reports that he has been clean for several months.   He reports that he was previously under contract with [employer] who assisted him with getting all of his medications, but reports that the contract ended last fall.  He is not currently taking any daily medications.  He reports a history of previous behavioral health admissions.  He has no other complaints including shortness of breath, fever, chills, cough, abdominal pain, nausea, vomiting, diarrhea, rash.  He reports that he had a negative COVID-19 test within the last week after being tested in [facility] at a drive-through clinic.     The history is provided by the  patient. No language interpreter was used.    Past Medical History:  Diagnosis Date  . ADHD   . Borderline personality disorder in adult [facility])   . Depression   . Hypertension     Patient Active Problem List   Diagnosis Date Noted  . Severe recurrent major depression without psychotic features (HCC) 09/08/2018  . Cannabis use disorder, moderate, dependence (HCC) 12/11/2016  . MDD (major depressive disorder), recurrent severe, without psychosis (HCC) 12/11/2016  . Cocaine abuse (HCC) 12/02/2016  . Polysubstance abuse (HCC) 11/27/2016  . Substance induced mood disorder (HCC) 11/27/2016  . Severe episode of recurrent major depressive disorder, without psychotic features (HCC)   . Major depressive disorder, single episode, severe without psychotic features (HCC) 11/02/2016  . Stimulant use disorder 11/01/2016    Past Surgical History:  Procedure Laterality Date  . EXTERNAL EAR SURGERY Right    "it was cutt off and sutured back on"        Home Medications    Prior to Admission medications   Medication Sig Start Date End Date Taking? Authorizing Provider  amLODipine (NORVASC) 10 MG tablet Take 10 mg by mouth daily. 06/03/17   [provider]  hydrochlorothiazide (HYDRODIURIL) 25 MG tablet Take 1 tablet (25 mg total) by mouth daily. 12/16/16   Money, Gerlene Burdock, FNP  hydrOXYzine (ATARAX/VISTARIL) 25 MG tablet Take 1 tablet (25 mg total) by mouth every 6 (six) hours as needed for anxiety. 12/16/16   Money, Gerlene Burdock, FNP  lisinopril (ZESTRIL) 10 MG tablet Take 10 mg by mouth daily. 07/10/18   [provider]  traZODone (DESYREL) 50 MG tablet Take 1 tablet (50 mg total) by mouth at bedtime as needed for sleep. 12/16/16   Money, Gerlene Burdockravis B, FNP    Family History Family History  Problem Relation Age of Onset  . Mental illness Neg Hx     Social History Social History   Tobacco Use  . Smoking status: Current Every Day Smoker  . Smokeless tobacco: Never Used  Substance  Use Topics  . Alcohol use: No  . Drug use: Yes    Types: Marijuana     Allergies   Patient has no known allergies.   Review of Systems Review of Systems  Constitutional: Negative for appetite change, chills and fever.  Respiratory: Negative for shortness of breath and wheezing.   Cardiovascular: Negative for chest pain and palpitations.  Gastrointestinal: Negative for abdominal pain, diarrhea, nausea and vomiting.  Genitourinary: Negative for dysuria.  Musculoskeletal: Negative for back pain.  Skin: Negative for rash.  Allergic/Immunologic: Negative for immunocompromised state.  Neurological: Negative for dizziness, weakness and headaches.  Psychiatric/Behavioral: Positive for dysphoric mood, sleep disturbance and suicidal ideas. Negative for confusion, hallucinations and self-injury.     Physical Exam Updated Vital Signs BP 119/68 (BP Location: Left Arm)   Pulse (!) 57   Temp 98.7 F (37.1 C) (Oral)   Resp 16   SpO2 99%   Physical Exam Vitals signs and nursing note reviewed.  Constitutional:      General: He is not in acute distress.    Appearance: He is well-developed. He is not ill-appearing, toxic-appearing or diaphoretic.  HENT:     Head: Normocephalic.  Eyes:     Conjunctiva/sclera: Conjunctivae normal.  Neck:     Musculoskeletal: Neck supple.  Cardiovascular:     Rate and Rhythm: Normal rate and regular rhythm.     Pulses: Normal pulses.     Heart sounds: Normal heart sounds. No murmur. No friction rub. No gallop.   Pulmonary:     Effort: Pulmonary effort is normal. No respiratory distress.     Breath sounds: No stridor. No wheezing, rhonchi or rales.  Chest:     Chest wall: No tenderness.  Abdominal:     General: There is no distension.     Palpations: Abdomen is soft. There is no mass.     Tenderness: There is no abdominal tenderness. There is no right CVA tenderness, left CVA tenderness, guarding or rebound.     Hernia: No hernia is present.   Skin:    General: Skin is warm and dry.  Neurological:     Mental Status: He is alert.  Psychiatric:        Attention and Perception: Attention and perception normal.        Mood and Affect: Affect is flat.        Speech: Speech normal.        Behavior: Behavior normal. Behavior is cooperative.        Thought Content: Thought content is not paranoid or delusional. Thought content includes suicidal ideation. Thought content does not include homicidal ideation. Thought content includes suicidal plan. Thought content does not include homicidal plan.        Cognition and Memory: Cognition and memory normal.    ED Treatments / Results  Labs (all labs ordered are listed, but only abnormal results are displayed) Labs Reviewed  COMPREHENSIVE METABOLIC PANEL - Abnormal; Notable  for the following components:      Result Value   Glucose, Bld 104 (*)    BUN 23 (*)    All other components within normal limits  ACETAMINOPHEN LEVEL - Abnormal; Notable for the following components:   Acetaminophen (Tylenol), Serum <10 (*)    All other components within normal limits  ETHANOL  RAPID URINE DRUG SCREEN, HOSP PERFORMED  CBC WITH DIFFERENTIAL/PLATELET  SALICYLATE LEVEL    EKG None  Radiology No results found.  Procedures Procedures (including critical care time)  Medications Ordered in ED Medications - No data to display   Initial Impression / Assessment and Plan / ED Course  I have reviewed the triage vital signs and the nursing notes.  Pertinent labs & imaging results that were available during my care of the patient were reviewed by me and considered in my medical decision making (see chart for details).        33 year old male with a history of borderline personality disorder, ADHD, depression, HTN presenting with suicidal ideation and plan.  He reports associated low mood for the last few weeks.  States that recent stressors include losing his job.  He is hemodynamically stable  and in no acute distress.  Per chart review, patient appears to have multiple, frequent encounters at EDs in the [place] area, including [place], [place], and [facility].  Labs today are unremarkable.  UDS is negative.  The patient is voluntary.  He is also medically cleared at this time.  TTS consulted.  Overnight observation recommended at [facility] observation unit.  Psych holding orders have been placed.  Home medication orders have been placed.  Patient has remained hemodynamically stable and in no acute distress.  Safe for transfer to [facility] observation unit at this time.   Final Clinical Impressions(s) / ED Diagnoses   Final diagnoses:  Major depressive disorder, recurrent, severe without psychotic features [facility])    ED Discharge Orders    None       Javis Abboud A, PA-C 09/08/18 0403    Dione Booze, MD 09/08/18 [phone]

## 2018-09-07 NOTE — ED Triage Notes (Signed)
Pt reports feeling suicidal d/t pandemic reports has lost his job can see his counselor or therapist and denies having support system in place ie no family present

## 2018-09-08 ENCOUNTER — Observation Stay (HOSPITAL_COMMUNITY)
Admission: AD | Admit: 2018-09-08 | Discharge: 2018-09-08 | Disposition: A | Discharge status: Home / Self Care | Payer: No Typology Code available for payment source | Source: Intra-hospital | Attending: Psychiatry | Admitting: Psychiatry

## 2018-09-08 ENCOUNTER — Other Ambulatory Visit: Payer: Self-pay

## 2018-09-08 ENCOUNTER — Encounter (HOSPITAL_COMMUNITY): Payer: Self-pay | Admitting: Emergency Medicine

## 2018-09-08 DIAGNOSIS — F332 Major depressive disorder, recurrent severe without psychotic features: Secondary | ICD-10-CM | POA: Diagnosis not present

## 2018-09-08 DIAGNOSIS — F603 Borderline personality disorder: Secondary | ICD-10-CM | POA: Insufficient documentation

## 2018-09-08 DIAGNOSIS — I10 Essential (primary) hypertension: Secondary | ICD-10-CM | POA: Insufficient documentation

## 2018-09-08 DIAGNOSIS — F909 Attention-deficit hyperactivity disorder, unspecified type: Secondary | ICD-10-CM | POA: Insufficient documentation

## 2018-09-08 DIAGNOSIS — G4700 Insomnia, unspecified: Secondary | ICD-10-CM | POA: Insufficient documentation

## 2018-09-08 DIAGNOSIS — F4321 Adjustment disorder with depressed mood: Principal | ICD-10-CM

## 2018-09-08 DIAGNOSIS — Z79899 Other long term (current) drug therapy: Secondary | ICD-10-CM | POA: Insufficient documentation

## 2018-09-08 DIAGNOSIS — R45851 Suicidal ideations: Secondary | ICD-10-CM | POA: Insufficient documentation

## 2018-09-08 DIAGNOSIS — F419 Anxiety disorder, unspecified: Secondary | ICD-10-CM | POA: Insufficient documentation

## 2018-09-08 DIAGNOSIS — F1910 Other psychoactive substance abuse, uncomplicated: Secondary | ICD-10-CM | POA: Diagnosis present

## 2018-09-08 DIAGNOSIS — F17200 Nicotine dependence, unspecified, uncomplicated: Secondary | ICD-10-CM | POA: Insufficient documentation

## 2018-09-08 DIAGNOSIS — F1994 Other psychoactive substance use, unspecified with psychoactive substance-induced mood disorder: Secondary | ICD-10-CM | POA: Insufficient documentation

## 2018-09-08 LAB — COMPREHENSIVE METABOLIC PANEL
ALT: 41 U/L (ref 0–44)
AST: 24 U/L (ref 15–41)
Albumin: 4.4 g/dL (ref 3.5–5.0)
Alkaline Phosphatase: 62 U/L (ref 38–126)
Anion gap: 9 (ref 5–15)
BUN: 23 mg/dL — ABNORMAL HIGH (ref 6–20)
CO2: 23 mmol/L (ref 22–32)
Calcium: 9.5 mg/dL (ref 8.9–10.3)
Chloride: 108 mmol/L (ref 98–111)
Creatinine, Ser: 1.15 mg/dL (ref 0.61–1.24)
GFR calc Af Amer: >60 mL/min (ref >60)
GFR calc non Af Amer: >60 mL/min (ref >60)
Glucose, Bld: 104 mg/dL — ABNORMAL HIGH (ref 70–99)
Potassium: 4 mmol/L (ref 3.5–5.1)
Sodium: 140 mmol/L (ref 135–145)
Total Bilirubin: 0.4 mg/dL (ref 0.3–1.2)
Total Protein: 7.5 g/dL (ref 6.5–8.1)

## 2018-09-08 LAB — ACETAMINOPHEN LEVEL: Acetaminophen (Tylenol), Serum: <10 ug/mL — ABNORMAL LOW (ref 10–30)

## 2018-09-08 LAB — SALICYLATE LEVEL: Salicylate Lvl: <7 mg/dL (ref 2.8–30.0)

## 2018-09-08 LAB — ETHANOL: Alcohol, Ethyl (B): <10 mg/dL (ref <10)

## 2018-09-08 MED ORDER — TRAZODONE HCL 50 MG PO TABS: 50.0000 mg | ORAL_TABLET | Freq: Every evening | ORAL | Status: DC | PRN

## 2018-09-08 MED ORDER — NICOTINE 14 MG/24HR TD PT24
14.0000 mg | MEDICATED_PATCH | Freq: Every day | TRANSDERMAL | Status: DC
  Filled 2018-09-08: qty 1

## 2018-09-08 MED ORDER — MAGNESIUM HYDROXIDE 400 MG/5ML PO SUSP: 30.0000 mL | Freq: Every day | ORAL | Status: DC | PRN

## 2018-09-08 MED ORDER — AMLODIPINE BESYLATE 5 MG PO TABS
10.0000 mg | ORAL_TABLET | Freq: Every day | ORAL | Status: DC
  Administered 2018-09-08: 10 mg via ORAL
  Filled 2018-09-08: qty 2

## 2018-09-08 MED ORDER — ALUM & MAG HYDROXIDE-SIMETH 200-200-20 MG/5ML PO SUSP: 30.0000 mL | ORAL | Status: DC | PRN

## 2018-09-08 MED ORDER — HYDROCHLOROTHIAZIDE 25 MG PO TABS
25.0000 mg | ORAL_TABLET | Freq: Every day | ORAL | Status: DC
  Administered 2018-09-08: 25 mg via ORAL
  Filled 2018-09-08: qty 1

## 2018-09-08 MED ORDER — LISINOPRIL 10 MG PO TABS: 10.0000 mg | ORAL_TABLET | Freq: Every day | ORAL | 0 refills | Status: AC

## 2018-09-08 MED ORDER — LISINOPRIL 10 MG PO TABS
10.0000 mg | ORAL_TABLET | Freq: Every day | ORAL | Status: DC
  Administered 2018-09-08: 10 mg via ORAL
  Filled 2018-09-08: qty 1

## 2018-09-08 MED ORDER — HYDROXYZINE HCL 25 MG PO TABS: 25.0000 mg | ORAL_TABLET | Freq: Four times a day (QID) | ORAL | Status: DC | PRN

## 2018-09-08 MED ORDER — TRAZODONE HCL 100 MG PO TABS: 100.0000 mg | ORAL_TABLET | Freq: Every day | ORAL | Status: DC

## 2018-09-08 MED ORDER — ACETAMINOPHEN 325 MG PO TABS: 650.0000 mg | ORAL_TABLET | Freq: Four times a day (QID) | ORAL | Status: DC | PRN

## 2018-09-08 NOTE — BHH Counselor (Signed)
Clinician attempted to call pt's nurse however there was no response. Clinician contacted receptionist and was transferred to pt's nurse however no one answered. Clinician to check back.    Redmond Pulling, MS, [facility], [facility] Triage Specialist [phone]

## 2018-09-08 NOTE — BHH Counselor (Signed)
At 0110, pt mentioned during assessment no family, friend supports clinician can contact to obtain collateral information.    Redmond Pulling, MS, [facility], [facility] Triage Specialist [phone]

## 2018-09-08 NOTE — ED Notes (Addendum)
Voluntary admission form signed by pt.  [facility] called for transport
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
## 2018-09-08 NOTE — Progress Notes (Signed)
Pt sleeping at present, no distress noted, calm & cooperative at present.  Monitoring for safety. 
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
## 2018-09-08 NOTE — Progress Notes (Signed)
Patient ID: Don Vaughn, male   DOB: 02-Aug-1985, 33 y.o.   MRN: [id number] Patient discharged to home/self care on his how accord.  Patient denies SI, HI and AVH upon discharge.  Patient given referrals to outside resources.  Patient belongings returned upon discharge.
